# Patient Record
Sex: Female | Born: 1955 | Race: White | Hispanic: No | Marital: Married
Health system: Southern US, Community
[De-identification: ages and names within clinical notes are randomized; demographics above are authoritative.]

## PROBLEM LIST (undated history)

## (undated) DIAGNOSIS — F32A Depression, unspecified: Secondary | ICD-10-CM

## (undated) DIAGNOSIS — K5190 Ulcerative colitis, unspecified, without complications: Secondary | ICD-10-CM

## (undated) DIAGNOSIS — L932 Other local lupus erythematosus: Secondary | ICD-10-CM

## (undated) DIAGNOSIS — L409 Psoriasis, unspecified: Secondary | ICD-10-CM

## (undated) DIAGNOSIS — F419 Anxiety disorder, unspecified: Secondary | ICD-10-CM

## (undated) HISTORY — DX: Other local lupus erythematosus: L93.2

## (undated) HISTORY — PX: TUBAL LIGATION: SHX77

## (undated) HISTORY — DX: Psoriasis, unspecified: L40.9

## (undated) HISTORY — DX: Depression, unspecified: F32.A

## (undated) HISTORY — PX: COLONOSCOPY: SHX174

## (undated) HISTORY — DX: Ulcerative colitis, unspecified, without complications: K51.90

## (undated) HISTORY — PX: LUMBAR DISC SURGERY: SHX700

## (undated) HISTORY — DX: Anxiety disorder, unspecified: F41.9

---

## 1998-01-31 ENCOUNTER — Other Ambulatory Visit: Admission: RE | Admit: 1998-01-31 | Discharge: 1998-01-31 | Payer: Self-pay | Admitting: Gynecology

## 1998-02-21 ENCOUNTER — Ambulatory Visit (HOSPITAL_COMMUNITY): Admission: RE | Admit: 1998-02-21 | Discharge: 1998-02-21 | Payer: Self-pay | Admitting: Gynecology

## 2000-03-03 ENCOUNTER — Ambulatory Visit (HOSPITAL_COMMUNITY): Admission: RE | Admit: 2000-03-03 | Discharge: 2000-03-03 | Payer: Self-pay | Admitting: Gastroenterology

## 2000-03-03 ENCOUNTER — Encounter (INDEPENDENT_AMBULATORY_CARE_PROVIDER_SITE_OTHER): Payer: Self-pay

## 2000-04-06 ENCOUNTER — Encounter: Payer: Self-pay | Admitting: Gynecology

## 2000-04-06 ENCOUNTER — Ambulatory Visit (HOSPITAL_COMMUNITY): Admission: RE | Admit: 2000-04-06 | Discharge: 2000-04-06 | Payer: Self-pay | Admitting: Gynecology

## 2000-04-10 ENCOUNTER — Encounter: Admission: RE | Admit: 2000-04-10 | Discharge: 2000-04-10 | Payer: Self-pay | Admitting: Gynecology

## 2000-04-10 ENCOUNTER — Encounter: Payer: Self-pay | Admitting: Gynecology

## 2000-04-27 ENCOUNTER — Other Ambulatory Visit: Admission: RE | Admit: 2000-04-27 | Discharge: 2000-04-27 | Payer: Self-pay | Admitting: Gynecology

## 2000-09-10 ENCOUNTER — Other Ambulatory Visit: Admission: RE | Admit: 2000-09-10 | Discharge: 2000-09-10 | Payer: Self-pay | Admitting: Gynecology

## 2000-09-10 ENCOUNTER — Encounter (INDEPENDENT_AMBULATORY_CARE_PROVIDER_SITE_OTHER): Payer: Self-pay | Admitting: Specialist

## 2001-05-10 ENCOUNTER — Other Ambulatory Visit: Admission: RE | Admit: 2001-05-10 | Discharge: 2001-05-10 | Payer: Self-pay | Admitting: Gynecology

## 2001-08-16 ENCOUNTER — Encounter: Payer: Self-pay | Admitting: Gynecology

## 2001-08-16 ENCOUNTER — Encounter: Admission: RE | Admit: 2001-08-16 | Discharge: 2001-08-16 | Payer: Self-pay | Admitting: Gynecology

## 2002-09-05 ENCOUNTER — Encounter: Admission: RE | Admit: 2002-09-05 | Discharge: 2002-09-05 | Payer: Self-pay | Admitting: Gynecology

## 2002-09-05 ENCOUNTER — Other Ambulatory Visit: Admission: RE | Admit: 2002-09-05 | Discharge: 2002-09-05 | Payer: Self-pay | Admitting: Gynecology

## 2002-09-05 ENCOUNTER — Encounter: Payer: Self-pay | Admitting: Gynecology

## 2003-11-06 ENCOUNTER — Other Ambulatory Visit: Admission: RE | Admit: 2003-11-06 | Discharge: 2003-11-06 | Payer: Self-pay | Admitting: Gynecology

## 2003-11-27 ENCOUNTER — Encounter: Admission: RE | Admit: 2003-11-27 | Discharge: 2003-11-27 | Payer: Self-pay | Admitting: Gynecology

## 2004-12-02 ENCOUNTER — Encounter: Admission: RE | Admit: 2004-12-02 | Discharge: 2004-12-02 | Payer: Self-pay | Admitting: Gynecology

## 2004-12-23 ENCOUNTER — Other Ambulatory Visit: Admission: RE | Admit: 2004-12-23 | Discharge: 2004-12-23 | Payer: Self-pay | Admitting: Gynecology

## 2006-01-26 ENCOUNTER — Encounter: Admission: RE | Admit: 2006-01-26 | Discharge: 2006-01-26 | Payer: Self-pay | Admitting: Gynecology

## 2006-02-16 ENCOUNTER — Other Ambulatory Visit: Admission: RE | Admit: 2006-02-16 | Discharge: 2006-02-16 | Payer: Self-pay | Admitting: Gynecology

## 2007-02-01 ENCOUNTER — Encounter: Admission: RE | Admit: 2007-02-01 | Discharge: 2007-02-01 | Payer: Self-pay | Admitting: Gynecology

## 2007-02-22 ENCOUNTER — Other Ambulatory Visit: Admission: RE | Admit: 2007-02-22 | Discharge: 2007-02-22 | Payer: Self-pay | Admitting: Gynecology

## 2008-02-08 ENCOUNTER — Encounter: Admission: RE | Admit: 2008-02-08 | Discharge: 2008-02-08 | Payer: Self-pay | Admitting: Gynecology

## 2008-02-23 ENCOUNTER — Other Ambulatory Visit: Admission: RE | Admit: 2008-02-23 | Discharge: 2008-02-23 | Payer: Self-pay | Admitting: Gynecology

## 2009-02-12 ENCOUNTER — Encounter: Admission: RE | Admit: 2009-02-12 | Discharge: 2009-02-12 | Payer: Self-pay | Admitting: Gynecology

## 2010-02-18 ENCOUNTER — Encounter: Admission: RE | Admit: 2010-02-18 | Discharge: 2010-02-18 | Payer: Self-pay | Admitting: Gynecology

## 2011-02-21 NOTE — Procedures (Signed)
[facility]  Patient:    Denise Chambers, Denise Chambers                          MRN: [id number] Adm. Date:  [id number] Disc. Date: [id number] Attending:  Deneen Harts CC:         Anna Genre. Little, M.D.                           Procedure Report  PROCEDURE:  Colonoscopy.  ENDOSCOPIST:  Griffith Citron, M.D.  INDICATION:  [age]-year-old white female presenting with hematochezia. Undergoing colonoscopy for neoplasia surveillance.  Prior history of acute ulcerative proctitis, April 1996.  DESCRIPTION OF PROCEDURE:  After reviewing the nature of the procedure with the patient including potential risks and complications and after discussing alternative methods of diagnosis and treatment, informed consent was signed.  Patient was premedicated, receiving IV sedation, totalling Versed 12 mg, Fentanyl 125 mcg, administered in divided doses prior to the onset of the procedure.  Procedure was initiated with a PCF-140L pediatric videocolonoscope.  The scope was advanced approximately one-third of the way into the colon, when clouding of the lens necessitated removal of the scope and cleansing of the colonoscope lens.  Scope was reinserted after digital examination, revealing no evidence of perianal or intrarectal pathology.  The scope was advanced without difficulty around the entire length of the colon to the cecum, identified by the appendiceal orifice and ileocecal valve.  Preparation was excellent throughout.  Scope was slowly withdrawn, with careful inspection of the entire colon in a retrograde manner including a retroflexed view in the rectal vault.  Beginning in the sigmoid colon, approximately a half dozen diminutive polyps were scattered through the rectum.  These were resected with hot biopsy forceps, recovered and submitted to pathology.  Photo-documentation obtained. Retroflexed view in the rectal vault did reveal early non-inflamed  internal hemorrhoids.  No other abnormality was present.  Colon was decompressed, scope withdrawn.  Patient tolerated the procedure without difficulty, being maintained on datascope monitor and low-flow oxygen throughout.  Time -- 2; technical -- 2; preparation -- 1; total score equals 5.  ASSESSMENT: 1. Polyps -- multiple, diminutive, rectosigmoid, resected with hot biopsy    forceps, pathology pending. 2. Internal hemorrhoids -- mild, non-inflamed.  RECOMMENDATIONS: 1. Post-polypectomy instructions reviewed. 2. Followup pathology. 3. Repeat colonoscopy in three years if adenoma; 10 years if hyperplastic. 4. Rectal care p.r.n. DD:  03/03/00 TD:  03/05/00 Job: [id number]

## 2011-03-25 ENCOUNTER — Other Ambulatory Visit: Payer: Self-pay | Admitting: Gynecology

## 2011-03-25 DIAGNOSIS — Z1231 Encounter for screening mammogram for malignant neoplasm of breast: Secondary | ICD-10-CM

## 2011-04-10 ENCOUNTER — Ambulatory Visit
Admission: RE | Admit: 2011-04-10 | Discharge: 2011-04-10 | Disposition: A | Discharge status: Home / Self Care | Payer: 59 | Source: Ambulatory Visit | Attending: Gynecology | Admitting: Gynecology

## 2011-04-10 DIAGNOSIS — Z1231 Encounter for screening mammogram for malignant neoplasm of breast: Secondary | ICD-10-CM

## 2011-05-21 ENCOUNTER — Ambulatory Visit (INDEPENDENT_AMBULATORY_CARE_PROVIDER_SITE_OTHER): Payer: 59 | Admitting: Surgery

## 2012-05-18 ENCOUNTER — Other Ambulatory Visit: Payer: Self-pay | Admitting: Gynecology

## 2012-05-18 DIAGNOSIS — Z1231 Encounter for screening mammogram for malignant neoplasm of breast: Secondary | ICD-10-CM

## 2012-06-04 ENCOUNTER — Ambulatory Visit
Admission: RE | Admit: 2012-06-04 | Discharge: 2012-06-04 | Disposition: A | Discharge status: Home / Self Care | Payer: 59 | Source: Ambulatory Visit | Attending: Gynecology | Admitting: Gynecology

## 2012-06-04 ENCOUNTER — Ambulatory Visit: Payer: 59

## 2012-06-04 DIAGNOSIS — Z1231 Encounter for screening mammogram for malignant neoplasm of breast: Secondary | ICD-10-CM

## 2013-08-03 ENCOUNTER — Other Ambulatory Visit: Payer: Self-pay

## 2013-08-03 DIAGNOSIS — Z1231 Encounter for screening mammogram for malignant neoplasm of breast: Secondary | ICD-10-CM

## 2013-09-05 ENCOUNTER — Ambulatory Visit
Admission: RE | Admit: 2013-09-05 | Discharge: 2013-09-05 | Disposition: A | Discharge status: Home / Self Care | Payer: BC Managed Care – PPO | Source: Ambulatory Visit

## 2013-09-05 DIAGNOSIS — Z1231 Encounter for screening mammogram for malignant neoplasm of breast: Secondary | ICD-10-CM

## 2014-12-18 ENCOUNTER — Other Ambulatory Visit: Payer: Self-pay

## 2014-12-18 DIAGNOSIS — Z1231 Encounter for screening mammogram for malignant neoplasm of breast: Secondary | ICD-10-CM

## 2014-12-25 ENCOUNTER — Ambulatory Visit
Admission: RE | Admit: 2014-12-25 | Discharge: 2014-12-25 | Disposition: A | Discharge status: Home / Self Care | Payer: 59 | Source: Ambulatory Visit

## 2014-12-25 DIAGNOSIS — Z1231 Encounter for screening mammogram for malignant neoplasm of breast: Secondary | ICD-10-CM

## 2016-02-19 ENCOUNTER — Other Ambulatory Visit: Payer: Self-pay

## 2016-02-19 DIAGNOSIS — Z1231 Encounter for screening mammogram for malignant neoplasm of breast: Secondary | ICD-10-CM

## 2016-03-06 ENCOUNTER — Ambulatory Visit
Admission: RE | Admit: 2016-03-06 | Discharge: 2016-03-06 | Disposition: A | Discharge status: Home / Self Care | Payer: 59 | Source: Ambulatory Visit

## 2016-03-06 DIAGNOSIS — Z1231 Encounter for screening mammogram for malignant neoplasm of breast: Secondary | ICD-10-CM

## 2017-02-18 ENCOUNTER — Other Ambulatory Visit: Payer: Self-pay | Admitting: Obstetrics and Gynecology

## 2017-02-18 DIAGNOSIS — Z1231 Encounter for screening mammogram for malignant neoplasm of breast: Secondary | ICD-10-CM

## 2017-03-20 ENCOUNTER — Ambulatory Visit
Admission: RE | Admit: 2017-03-20 | Discharge: 2017-03-20 | Disposition: A | Discharge status: Home / Self Care | Payer: 59 | Source: Ambulatory Visit | Attending: Obstetrics and Gynecology | Admitting: Obstetrics and Gynecology

## 2017-03-20 DIAGNOSIS — Z1231 Encounter for screening mammogram for malignant neoplasm of breast: Secondary | ICD-10-CM

## 2017-12-04 DIAGNOSIS — M25512 Pain in left shoulder: Secondary | ICD-10-CM | POA: Diagnosis not present

## 2017-12-15 DIAGNOSIS — Z0000 Encounter for general adult medical examination without abnormal findings: Secondary | ICD-10-CM | POA: Diagnosis not present

## 2017-12-15 DIAGNOSIS — M859 Disorder of bone density and structure, unspecified: Secondary | ICD-10-CM | POA: Diagnosis not present

## 2017-12-15 DIAGNOSIS — E7849 Other hyperlipidemia: Secondary | ICD-10-CM | POA: Diagnosis not present

## 2017-12-21 DIAGNOSIS — J3089 Other allergic rhinitis: Secondary | ICD-10-CM | POA: Diagnosis not present

## 2017-12-21 DIAGNOSIS — N393 Stress incontinence (female) (male): Secondary | ICD-10-CM | POA: Diagnosis not present

## 2017-12-21 DIAGNOSIS — Z1331 Encounter for screening for depression: Secondary | ICD-10-CM | POA: Diagnosis not present

## 2017-12-21 DIAGNOSIS — Z0000 Encounter for general adult medical examination without abnormal findings: Secondary | ICD-10-CM | POA: Diagnosis not present

## 2017-12-21 DIAGNOSIS — K5190 Ulcerative colitis, unspecified, without complications: Secondary | ICD-10-CM | POA: Diagnosis not present

## 2017-12-21 DIAGNOSIS — M2550 Pain in unspecified joint: Secondary | ICD-10-CM | POA: Diagnosis not present

## 2017-12-24 DIAGNOSIS — Z1212 Encounter for screening for malignant neoplasm of rectum: Secondary | ICD-10-CM | POA: Diagnosis not present

## 2018-03-03 DIAGNOSIS — K5900 Constipation, unspecified: Secondary | ICD-10-CM | POA: Diagnosis not present

## 2018-03-03 DIAGNOSIS — K625 Hemorrhage of anus and rectum: Secondary | ICD-10-CM | POA: Diagnosis not present

## 2018-03-03 DIAGNOSIS — Z86010 Personal history of colonic polyps: Secondary | ICD-10-CM | POA: Insufficient documentation

## 2018-03-03 DIAGNOSIS — K5120 Ulcerative (chronic) proctitis without complications: Secondary | ICD-10-CM | POA: Insufficient documentation

## 2018-04-19 ENCOUNTER — Other Ambulatory Visit: Payer: Self-pay | Admitting: Obstetrics and Gynecology

## 2018-04-19 DIAGNOSIS — Z1231 Encounter for screening mammogram for malignant neoplasm of breast: Secondary | ICD-10-CM

## 2018-05-07 ENCOUNTER — Ambulatory Visit: Payer: 59

## 2018-05-31 ENCOUNTER — Ambulatory Visit
Admission: RE | Admit: 2018-05-31 | Discharge: 2018-05-31 | Disposition: A | Discharge status: Home / Self Care | Payer: BLUE CROSS/BLUE SHIELD | Source: Ambulatory Visit | Attending: Obstetrics and Gynecology | Admitting: Obstetrics and Gynecology

## 2018-05-31 DIAGNOSIS — Z1231 Encounter for screening mammogram for malignant neoplasm of breast: Secondary | ICD-10-CM

## 2018-08-25 DIAGNOSIS — M79604 Pain in right leg: Secondary | ICD-10-CM | POA: Diagnosis not present

## 2018-09-08 DIAGNOSIS — M79604 Pain in right leg: Secondary | ICD-10-CM | POA: Diagnosis not present

## 2018-09-14 DIAGNOSIS — G629 Polyneuropathy, unspecified: Secondary | ICD-10-CM | POA: Diagnosis not present

## 2018-09-20 DIAGNOSIS — M9902 Segmental and somatic dysfunction of thoracic region: Secondary | ICD-10-CM | POA: Diagnosis not present

## 2018-09-20 DIAGNOSIS — M9903 Segmental and somatic dysfunction of lumbar region: Secondary | ICD-10-CM | POA: Diagnosis not present

## 2018-09-20 DIAGNOSIS — M9905 Segmental and somatic dysfunction of pelvic region: Secondary | ICD-10-CM | POA: Diagnosis not present

## 2018-09-20 DIAGNOSIS — M9906 Segmental and somatic dysfunction of lower extremity: Secondary | ICD-10-CM | POA: Diagnosis not present

## 2018-10-11 DIAGNOSIS — K219 Gastro-esophageal reflux disease without esophagitis: Secondary | ICD-10-CM | POA: Diagnosis not present

## 2018-10-11 DIAGNOSIS — Z6828 Body mass index (BMI) 28.0-28.9, adult: Secondary | ICD-10-CM | POA: Diagnosis not present

## 2018-10-11 DIAGNOSIS — M25551 Pain in right hip: Secondary | ICD-10-CM | POA: Diagnosis not present

## 2018-10-11 DIAGNOSIS — M545 Low back pain: Secondary | ICD-10-CM | POA: Diagnosis not present

## 2018-10-12 ENCOUNTER — Ambulatory Visit
Admission: RE | Admit: 2018-10-12 | Discharge: 2018-10-12 | Disposition: A | Discharge status: Home / Self Care | Payer: BLUE CROSS/BLUE SHIELD | Source: Ambulatory Visit | Attending: Internal Medicine | Admitting: Internal Medicine

## 2018-10-12 ENCOUNTER — Other Ambulatory Visit: Payer: Self-pay | Admitting: Internal Medicine

## 2018-10-12 DIAGNOSIS — M48061 Spinal stenosis, lumbar region without neurogenic claudication: Secondary | ICD-10-CM | POA: Diagnosis not present

## 2018-10-12 DIAGNOSIS — M25551 Pain in right hip: Secondary | ICD-10-CM

## 2018-10-12 DIAGNOSIS — M5126 Other intervertebral disc displacement, lumbar region: Secondary | ICD-10-CM | POA: Diagnosis not present

## 2018-10-12 DIAGNOSIS — M47816 Spondylosis without myelopathy or radiculopathy, lumbar region: Secondary | ICD-10-CM | POA: Diagnosis not present

## 2018-10-12 DIAGNOSIS — M545 Low back pain, unspecified: Secondary | ICD-10-CM

## 2018-10-12 MED ORDER — GADOBENATE DIMEGLUMINE 529 MG/ML IV SOLN
15.0000 mL | Freq: Once | INTRAVENOUS | Status: AC | PRN
  Administered 2018-10-12: 15 mL via INTRAVENOUS

## 2018-10-15 DIAGNOSIS — M47816 Spondylosis without myelopathy or radiculopathy, lumbar region: Secondary | ICD-10-CM | POA: Diagnosis not present

## 2018-10-15 DIAGNOSIS — M5416 Radiculopathy, lumbar region: Secondary | ICD-10-CM | POA: Diagnosis not present

## 2018-10-15 DIAGNOSIS — M546 Pain in thoracic spine: Secondary | ICD-10-CM | POA: Diagnosis not present

## 2018-10-15 DIAGNOSIS — M5136 Other intervertebral disc degeneration, lumbar region: Secondary | ICD-10-CM | POA: Diagnosis not present

## 2018-10-15 DIAGNOSIS — M5126 Other intervertebral disc displacement, lumbar region: Secondary | ICD-10-CM | POA: Diagnosis not present

## 2018-10-15 DIAGNOSIS — M549 Dorsalgia, unspecified: Secondary | ICD-10-CM | POA: Diagnosis not present

## 2018-10-20 DIAGNOSIS — M48061 Spinal stenosis, lumbar region without neurogenic claudication: Secondary | ICD-10-CM | POA: Diagnosis not present

## 2018-10-20 DIAGNOSIS — M4726 Other spondylosis with radiculopathy, lumbar region: Secondary | ICD-10-CM | POA: Diagnosis not present

## 2018-10-20 DIAGNOSIS — M5136 Other intervertebral disc degeneration, lumbar region: Secondary | ICD-10-CM | POA: Diagnosis not present

## 2018-11-03 DIAGNOSIS — L821 Other seborrheic keratosis: Secondary | ICD-10-CM | POA: Diagnosis not present

## 2018-11-03 DIAGNOSIS — L570 Actinic keratosis: Secondary | ICD-10-CM | POA: Diagnosis not present

## 2018-11-15 ENCOUNTER — Ambulatory Visit: Payer: BLUE CROSS/BLUE SHIELD | Admitting: Diagnostic Neuroimaging

## 2018-12-06 DIAGNOSIS — Z01419 Encounter for gynecological examination (general) (routine) without abnormal findings: Secondary | ICD-10-CM | POA: Diagnosis not present

## 2018-12-06 DIAGNOSIS — Z6829 Body mass index (BMI) 29.0-29.9, adult: Secondary | ICD-10-CM | POA: Diagnosis not present

## 2018-12-13 DIAGNOSIS — M5416 Radiculopathy, lumbar region: Secondary | ICD-10-CM | POA: Diagnosis not present

## 2018-12-13 DIAGNOSIS — M47816 Spondylosis without myelopathy or radiculopathy, lumbar region: Secondary | ICD-10-CM | POA: Diagnosis not present

## 2018-12-13 DIAGNOSIS — M5136 Other intervertebral disc degeneration, lumbar region: Secondary | ICD-10-CM | POA: Diagnosis not present

## 2018-12-13 DIAGNOSIS — M5126 Other intervertebral disc displacement, lumbar region: Secondary | ICD-10-CM | POA: Diagnosis not present

## 2018-12-27 DIAGNOSIS — M48061 Spinal stenosis, lumbar region without neurogenic claudication: Secondary | ICD-10-CM | POA: Diagnosis not present

## 2018-12-27 DIAGNOSIS — M4726 Other spondylosis with radiculopathy, lumbar region: Secondary | ICD-10-CM | POA: Diagnosis not present

## 2018-12-27 DIAGNOSIS — M5136 Other intervertebral disc degeneration, lumbar region: Secondary | ICD-10-CM | POA: Diagnosis not present

## 2019-03-15 DIAGNOSIS — M5416 Radiculopathy, lumbar region: Secondary | ICD-10-CM | POA: Diagnosis not present

## 2019-03-15 DIAGNOSIS — M47816 Spondylosis without myelopathy or radiculopathy, lumbar region: Secondary | ICD-10-CM | POA: Diagnosis not present

## 2019-03-15 DIAGNOSIS — M5126 Other intervertebral disc displacement, lumbar region: Secondary | ICD-10-CM | POA: Diagnosis not present

## 2019-03-15 DIAGNOSIS — M48062 Spinal stenosis, lumbar region with neurogenic claudication: Secondary | ICD-10-CM | POA: Diagnosis not present

## 2019-03-24 DIAGNOSIS — M5136 Other intervertebral disc degeneration, lumbar region: Secondary | ICD-10-CM | POA: Diagnosis not present

## 2019-03-24 DIAGNOSIS — M48061 Spinal stenosis, lumbar region without neurogenic claudication: Secondary | ICD-10-CM | POA: Diagnosis not present

## 2019-03-24 DIAGNOSIS — M4726 Other spondylosis with radiculopathy, lumbar region: Secondary | ICD-10-CM | POA: Diagnosis not present

## 2019-04-22 ENCOUNTER — Other Ambulatory Visit: Payer: Self-pay | Admitting: Obstetrics and Gynecology

## 2019-04-22 DIAGNOSIS — Z1231 Encounter for screening mammogram for malignant neoplasm of breast: Secondary | ICD-10-CM

## 2019-05-03 ENCOUNTER — Ambulatory Visit (INDEPENDENT_AMBULATORY_CARE_PROVIDER_SITE_OTHER): Payer: BC Managed Care – PPO | Admitting: Family Medicine

## 2019-05-03 ENCOUNTER — Encounter: Payer: Self-pay | Admitting: Family Medicine

## 2019-05-03 DIAGNOSIS — M5441 Lumbago with sciatica, right side: Secondary | ICD-10-CM

## 2019-05-03 DIAGNOSIS — G8929 Other chronic pain: Secondary | ICD-10-CM | POA: Diagnosis not present

## 2019-05-03 NOTE — Progress Notes (Signed)
   Office Visit Note   Patient: Denise Chambers           Date of Birth: [birth year]           MRN: [id number] Visit Date: 05/03/2019 Requested by: Shon Baton, [address] [place],  [place] [address] PCP: Shon Baton, MD  Subjective: Chief Complaint  Patient presents with  . Lower Back - Pain    Referred by [facility]. Has been going to [facility] for LBP. Brought CDs of plain xrays & MRI. S/p ESI x 3. Hurts to sit longer than 10 minutes, without having to stretch. Walks every day for exercise.    HPI: She is a 63 year old seen in the presence of [place] for low back and right leg pain.  Symptoms started in November, no injury.  She was doing a lot of traveling at that time and started noticing pain going down the posterior right hip into the lateral calf especially with sitting.  Sometimes it bothers her with walking.  Pain was severe at first.  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  She has been treated by Dr. Sherwood Gambler with 3 epidural steroid injections.  These have helped with her pain, she was able to sit for a while but now the pain is coming back.               ROS: Denies fevers or chills, denies bowel or bladder dysfunction.  All other systems were reviewed and are negative.  Objective: Vital Signs: There were no vitals taken for this visit.  Physical Exam:  General:  Alert and oriented, in no acute distress. Pulm:  Breathing unlabored. Psy:  Normal mood, congruent affect. Skin: No rash on her skin. Low back: No tenderness to palpation lumbar spinous processes.  Slight tenderness near the SI joint, negative stork test.  Negative straight leg raise on the left, slightly positive on the right.  Moderate pain in the sciatic notch, piriformis stretch is equivocal.  Lower extremity strength and reflexes are normal.  Imaging: None today.  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   Assessment & Plan: 1.  Chronic right posterior hip and leg pain, seems to be in the L4 distribution, suspect mainly related to disc protrusions. -We will try McKenzie protocol.  She also plans to try an inversion table.  If this makes her symptoms manageable, she can do this indefinitely and periodically get epidural injections if needed.     Procedures: No procedures performed  No notes on file     PMFS History: There are no active problems to display for this patient.  History reviewed. No pertinent past medical history.  Family History  Problem Relation Age of Onset  . Breast cancer Maternal Aunt     History reviewed. No pertinent surgical history. Social History   Occupational History  . Not on file  Tobacco Use  . Smoking status: Not on file  Substance and Sexual Activity  . Alcohol use: Not on file  . Drug use: Not on file  . Sexual activity: Not on file

## 2019-05-26 DIAGNOSIS — Z23 Encounter for immunization: Secondary | ICD-10-CM | POA: Diagnosis not present

## 2019-05-26 DIAGNOSIS — E7849 Other hyperlipidemia: Secondary | ICD-10-CM | POA: Diagnosis not present

## 2019-05-26 DIAGNOSIS — M859 Disorder of bone density and structure, unspecified: Secondary | ICD-10-CM | POA: Diagnosis not present

## 2019-05-26 DIAGNOSIS — Z0000 Encounter for general adult medical examination without abnormal findings: Secondary | ICD-10-CM | POA: Diagnosis not present

## 2019-06-01 DIAGNOSIS — R82998 Other abnormal findings in urine: Secondary | ICD-10-CM | POA: Diagnosis not present

## 2019-06-09 ENCOUNTER — Other Ambulatory Visit: Payer: Self-pay

## 2019-06-09 ENCOUNTER — Ambulatory Visit
Admission: RE | Admit: 2019-06-09 | Discharge: 2019-06-09 | Disposition: A | Discharge status: Home / Self Care | Payer: BC Managed Care – PPO | Source: Ambulatory Visit | Attending: Obstetrics and Gynecology | Admitting: Obstetrics and Gynecology

## 2019-06-09 DIAGNOSIS — Z1231 Encounter for screening mammogram for malignant neoplasm of breast: Secondary | ICD-10-CM

## 2019-06-10 DIAGNOSIS — Z0000 Encounter for general adult medical examination without abnormal findings: Secondary | ICD-10-CM | POA: Diagnosis not present

## 2019-06-10 DIAGNOSIS — K219 Gastro-esophageal reflux disease without esophagitis: Secondary | ICD-10-CM | POA: Diagnosis not present

## 2019-06-10 DIAGNOSIS — K5190 Ulcerative colitis, unspecified, without complications: Secondary | ICD-10-CM | POA: Diagnosis not present

## 2019-06-10 DIAGNOSIS — R030 Elevated blood-pressure reading, without diagnosis of hypertension: Secondary | ICD-10-CM | POA: Diagnosis not present

## 2019-06-10 DIAGNOSIS — M545 Low back pain: Secondary | ICD-10-CM | POA: Diagnosis not present

## 2019-06-16 ENCOUNTER — Other Ambulatory Visit: Payer: Self-pay | Admitting: Internal Medicine

## 2019-06-16 DIAGNOSIS — E785 Hyperlipidemia, unspecified: Secondary | ICD-10-CM

## 2019-06-17 DIAGNOSIS — M47816 Spondylosis without myelopathy or radiculopathy, lumbar region: Secondary | ICD-10-CM | POA: Diagnosis not present

## 2019-06-17 DIAGNOSIS — M48062 Spinal stenosis, lumbar region with neurogenic claudication: Secondary | ICD-10-CM | POA: Diagnosis not present

## 2019-06-17 DIAGNOSIS — M5126 Other intervertebral disc displacement, lumbar region: Secondary | ICD-10-CM | POA: Diagnosis not present

## 2019-06-17 DIAGNOSIS — M5136 Other intervertebral disc degeneration, lumbar region: Secondary | ICD-10-CM | POA: Diagnosis not present

## 2019-06-23 DIAGNOSIS — L821 Other seborrheic keratosis: Secondary | ICD-10-CM | POA: Diagnosis not present

## 2019-06-23 DIAGNOSIS — L720 Epidermal cyst: Secondary | ICD-10-CM | POA: Diagnosis not present

## 2019-06-23 DIAGNOSIS — D2271 Melanocytic nevi of right lower limb, including hip: Secondary | ICD-10-CM | POA: Diagnosis not present

## 2019-06-24 ENCOUNTER — Ambulatory Visit
Admission: RE | Admit: 2019-06-24 | Discharge: 2019-06-24 | Disposition: A | Discharge status: Home / Self Care | Payer: No Typology Code available for payment source | Source: Ambulatory Visit | Attending: Internal Medicine | Admitting: Internal Medicine

## 2019-06-24 DIAGNOSIS — Z8249 Family history of ischemic heart disease and other diseases of the circulatory system: Secondary | ICD-10-CM | POA: Diagnosis not present

## 2019-06-24 DIAGNOSIS — E785 Hyperlipidemia, unspecified: Secondary | ICD-10-CM

## 2019-06-27 DIAGNOSIS — Z1212 Encounter for screening for malignant neoplasm of rectum: Secondary | ICD-10-CM | POA: Diagnosis not present

## 2019-06-28 ENCOUNTER — Ambulatory Visit (INDEPENDENT_AMBULATORY_CARE_PROVIDER_SITE_OTHER): Payer: BC Managed Care – PPO | Admitting: Family Medicine

## 2019-06-28 ENCOUNTER — Encounter: Payer: Self-pay | Admitting: Family Medicine

## 2019-06-28 ENCOUNTER — Other Ambulatory Visit: Payer: Self-pay

## 2019-06-28 DIAGNOSIS — M79671 Pain in right foot: Secondary | ICD-10-CM

## 2019-06-28 DIAGNOSIS — G8929 Other chronic pain: Secondary | ICD-10-CM | POA: Diagnosis not present

## 2019-06-28 NOTE — Progress Notes (Signed)
I saw and examined the patient with Dr. Mayer Masker and agree with assessment and plan as outlined.    Right heel pain consistent with plantar fasciitis.  Tight hamstrings and heel cords predispose.  Back and right leg pain significantly better with inversion table.   Will treat with home stretches, ice water soaks.  Could do formal PT if not improving (has high deductible plan), or cortisone injection.

## 2019-06-28 NOTE — Progress Notes (Signed)
   Denise Chambers - 63 y.o. female MRN [id number]  Date of birth: 11/23/1955  Office Visit Note: Visit Date: 06/28/2019 PCP: Shon Baton, MD Referred by: Shon Baton, MD  Subjective: Chief Complaint  Patient presents with  . Right Foot - Pain    Pain medial aspect of heel with swelling, worsening since March of this year (when she started walking for exercise more). Pain is worse first thing in the morning.   HPI: Denise Chambers is a 63 y.o. female who comes in today with right heel pain for the past 6 months. Reports that pain is worse in the morning with first steps, improves mostly throughout the day but is sore after her walks. Has been walking ~3 miles daily for the past 6 months.   Reports improvement in right leg pain overall since using inversion table.    ROS Otherwise per HPI.  Assessment & Plan: Visit Diagnoses:  1. Chronic heel pain, right     Plan:  - home stretches of calves, heel cords - soak in ice water as tolerated - has high deductible. If she meets deductible, would like to do PT (will call back)  Meds & Orders: No orders of the defined types were placed in this encounter.  No orders of the defined types were placed in this encounter.   Follow-up: PRN  Procedures: No procedures performed  No notes on file   Clinical History: No specialty comments available.   She has no history on file for tobacco. No results for input(s): HGBA1C, LABURIC in the last 8760 hours.  Objective:  VS:  HT:    WT:   BMI:     BP:   HR: bpm  TEMP: ( )  RESP:  Gen: well appearing Resp: breathing comfortably Psych: mood congruent  Right Foot: Inspection: No obvious bony deformity.  No swelling, erythema, or bruising.   Palpation: TTP at medial calcaneus at insertion of plantar fascia.  ROM: Full  ROM of the ankle. Limited dorsiflexion of right, 90 degrees. Strength: 5/5 strength ankle in all planes Neurovascular: N/V intact distally in the lower extremity  Special tests: No pain with flexion of small toes against resistance. Negative squeeze. Negative tarsal tunnel sign.   Right leg: TTP at medial leg proximal to knee No pain with resisted inversion, knee extension, knee flexion Passive abduction of leg with no pain  Imaging: No results found.  Past Medical/Family/Surgical/Social History: Medications & Allergies reviewed per EMR, new medications updated. Patient Active Problem List   Diagnosis Date Noted  . History of adenomatous polyp of colon 03/03/2018  . 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 No pertinent past medical history. Family History  Problem Relation Age of Onset  . Breast cancer Maternal Aunt    History reviewed. No pertinent surgical history. Social History   Occupational History  . Not on file  Tobacco Use  . Smoking status: Not on file  Substance and Sexual Activity  . Alcohol use: Not on file  . Drug use: Not on file  . Sexual activity: Not on file

## 2019-08-22 ENCOUNTER — Other Ambulatory Visit: Payer: Self-pay

## 2019-08-22 DIAGNOSIS — Z20822 Contact with and (suspected) exposure to covid-19: Secondary | ICD-10-CM

## 2019-08-23 ENCOUNTER — Encounter: Payer: Self-pay | Admitting: Family Medicine

## 2019-08-25 LAB — NOVEL CORONAVIRUS, NAA: SARS-CoV-2, NAA: NOT DETECTED

## 2019-09-26 DIAGNOSIS — M5136 Other intervertebral disc degeneration, lumbar region: Secondary | ICD-10-CM | POA: Diagnosis not present

## 2019-09-26 DIAGNOSIS — M48061 Spinal stenosis, lumbar region without neurogenic claudication: Secondary | ICD-10-CM | POA: Diagnosis not present

## 2019-09-26 DIAGNOSIS — M4726 Other spondylosis with radiculopathy, lumbar region: Secondary | ICD-10-CM | POA: Diagnosis not present

## 2019-09-27 DIAGNOSIS — L570 Actinic keratosis: Secondary | ICD-10-CM | POA: Diagnosis not present

## 2019-09-27 DIAGNOSIS — L820 Inflamed seborrheic keratosis: Secondary | ICD-10-CM | POA: Diagnosis not present

## 2019-12-26 ENCOUNTER — Ambulatory Visit: Payer: BC Managed Care – PPO | Attending: Internal Medicine

## 2019-12-26 DIAGNOSIS — Z20822 Contact with and (suspected) exposure to covid-19: Secondary | ICD-10-CM | POA: Insufficient documentation

## 2019-12-27 LAB — NOVEL CORONAVIRUS, NAA: SARS-CoV-2, NAA: NOT DETECTED

## 2019-12-27 LAB — SARS-COV-2, NAA 2 DAY TAT

## 2020-02-21 DIAGNOSIS — M5416 Radiculopathy, lumbar region: Secondary | ICD-10-CM | POA: Diagnosis not present

## 2020-03-15 DIAGNOSIS — M5416 Radiculopathy, lumbar region: Secondary | ICD-10-CM | POA: Diagnosis not present

## 2020-04-02 ENCOUNTER — Other Ambulatory Visit: Payer: Self-pay | Admitting: Anesthesiology

## 2020-04-02 DIAGNOSIS — M5416 Radiculopathy, lumbar region: Secondary | ICD-10-CM

## 2020-04-23 ENCOUNTER — Ambulatory Visit
Admission: RE | Admit: 2020-04-23 | Discharge: 2020-04-23 | Disposition: A | Discharge status: Home / Self Care | Payer: Medicare Other | Source: Ambulatory Visit | Attending: Anesthesiology | Admitting: Anesthesiology

## 2020-04-23 DIAGNOSIS — M5416 Radiculopathy, lumbar region: Secondary | ICD-10-CM

## 2020-04-23 DIAGNOSIS — M48061 Spinal stenosis, lumbar region without neurogenic claudication: Secondary | ICD-10-CM | POA: Diagnosis not present

## 2020-04-24 DIAGNOSIS — K648 Other hemorrhoids: Secondary | ICD-10-CM | POA: Diagnosis not present

## 2020-04-24 DIAGNOSIS — D125 Benign neoplasm of sigmoid colon: Secondary | ICD-10-CM | POA: Diagnosis not present

## 2020-04-24 DIAGNOSIS — K5120 Ulcerative (chronic) proctitis without complications: Secondary | ICD-10-CM | POA: Diagnosis not present

## 2020-04-24 DIAGNOSIS — Z1211 Encounter for screening for malignant neoplasm of colon: Secondary | ICD-10-CM | POA: Diagnosis not present

## 2020-04-24 DIAGNOSIS — K5190 Ulcerative colitis, unspecified, without complications: Secondary | ICD-10-CM | POA: Diagnosis not present

## 2020-04-24 DIAGNOSIS — K51913 Ulcerative colitis, unspecified with fistula: Secondary | ICD-10-CM | POA: Diagnosis not present

## 2020-04-24 DIAGNOSIS — K635 Polyp of colon: Secondary | ICD-10-CM | POA: Diagnosis not present

## 2020-04-24 DIAGNOSIS — Z86010 Personal history of colonic polyps: Secondary | ICD-10-CM | POA: Diagnosis not present

## 2020-05-02 DIAGNOSIS — L821 Other seborrheic keratosis: Secondary | ICD-10-CM | POA: Diagnosis not present

## 2020-05-02 DIAGNOSIS — L2084 Intrinsic (allergic) eczema: Secondary | ICD-10-CM | POA: Diagnosis not present

## 2020-05-02 DIAGNOSIS — L570 Actinic keratosis: Secondary | ICD-10-CM | POA: Diagnosis not present

## 2020-05-07 DIAGNOSIS — M5126 Other intervertebral disc displacement, lumbar region: Secondary | ICD-10-CM | POA: Diagnosis not present

## 2020-05-07 DIAGNOSIS — M5416 Radiculopathy, lumbar region: Secondary | ICD-10-CM | POA: Diagnosis not present

## 2020-05-07 DIAGNOSIS — M48062 Spinal stenosis, lumbar region with neurogenic claudication: Secondary | ICD-10-CM | POA: Diagnosis not present

## 2020-05-07 DIAGNOSIS — M5136 Other intervertebral disc degeneration, lumbar region: Secondary | ICD-10-CM | POA: Diagnosis not present

## 2020-05-08 DIAGNOSIS — H524 Presbyopia: Secondary | ICD-10-CM | POA: Diagnosis not present

## 2020-05-09 DIAGNOSIS — Z6827 Body mass index (BMI) 27.0-27.9, adult: Secondary | ICD-10-CM | POA: Diagnosis not present

## 2020-05-09 DIAGNOSIS — Z01419 Encounter for gynecological examination (general) (routine) without abnormal findings: Secondary | ICD-10-CM | POA: Diagnosis not present

## 2020-05-22 DIAGNOSIS — M4726 Other spondylosis with radiculopathy, lumbar region: Secondary | ICD-10-CM | POA: Diagnosis not present

## 2020-05-22 DIAGNOSIS — M5116 Intervertebral disc disorders with radiculopathy, lumbar region: Secondary | ICD-10-CM | POA: Diagnosis not present

## 2020-05-22 DIAGNOSIS — M48062 Spinal stenosis, lumbar region with neurogenic claudication: Secondary | ICD-10-CM | POA: Diagnosis not present

## 2020-05-25 DIAGNOSIS — M8588 Other specified disorders of bone density and structure, other site: Secondary | ICD-10-CM | POA: Diagnosis not present

## 2020-05-25 DIAGNOSIS — N958 Other specified menopausal and perimenopausal disorders: Secondary | ICD-10-CM | POA: Diagnosis not present

## 2020-06-04 ENCOUNTER — Other Ambulatory Visit: Payer: Self-pay | Admitting: Obstetrics and Gynecology

## 2020-06-04 DIAGNOSIS — Z1231 Encounter for screening mammogram for malignant neoplasm of breast: Secondary | ICD-10-CM

## 2020-06-05 DIAGNOSIS — E559 Vitamin D deficiency, unspecified: Secondary | ICD-10-CM | POA: Diagnosis not present

## 2020-06-05 DIAGNOSIS — H524 Presbyopia: Secondary | ICD-10-CM | POA: Diagnosis not present

## 2020-06-05 DIAGNOSIS — E7800 Pure hypercholesterolemia, unspecified: Secondary | ICD-10-CM | POA: Diagnosis not present

## 2020-06-05 DIAGNOSIS — K5120 Ulcerative (chronic) proctitis without complications: Secondary | ICD-10-CM | POA: Diagnosis not present

## 2020-06-12 ENCOUNTER — Other Ambulatory Visit: Payer: Self-pay | Admitting: Internal Medicine

## 2020-06-12 DIAGNOSIS — K5190 Ulcerative colitis, unspecified, without complications: Secondary | ICD-10-CM | POA: Diagnosis not present

## 2020-06-12 DIAGNOSIS — Z79899 Other long term (current) drug therapy: Secondary | ICD-10-CM | POA: Diagnosis not present

## 2020-06-12 DIAGNOSIS — Z0000 Encounter for general adult medical examination without abnormal findings: Secondary | ICD-10-CM | POA: Diagnosis not present

## 2020-06-12 DIAGNOSIS — R911 Solitary pulmonary nodule: Secondary | ICD-10-CM

## 2020-06-12 DIAGNOSIS — Z1212 Encounter for screening for malignant neoplasm of rectum: Secondary | ICD-10-CM | POA: Diagnosis not present

## 2020-06-12 DIAGNOSIS — D8989 Other specified disorders involving the immune mechanism, not elsewhere classified: Secondary | ICD-10-CM | POA: Diagnosis not present

## 2020-06-18 DIAGNOSIS — M48062 Spinal stenosis, lumbar region with neurogenic claudication: Secondary | ICD-10-CM | POA: Diagnosis not present

## 2020-06-18 DIAGNOSIS — M5136 Other intervertebral disc degeneration, lumbar region: Secondary | ICD-10-CM | POA: Diagnosis not present

## 2020-06-18 DIAGNOSIS — M549 Dorsalgia, unspecified: Secondary | ICD-10-CM | POA: Diagnosis not present

## 2020-06-18 DIAGNOSIS — M5126 Other intervertebral disc displacement, lumbar region: Secondary | ICD-10-CM | POA: Diagnosis not present

## 2020-06-26 ENCOUNTER — Ambulatory Visit
Admission: RE | Admit: 2020-06-26 | Discharge: 2020-06-26 | Disposition: A | Discharge status: Home / Self Care | Payer: Medicare Other | Source: Ambulatory Visit | Attending: Obstetrics and Gynecology | Admitting: Obstetrics and Gynecology

## 2020-06-26 ENCOUNTER — Ambulatory Visit
Admission: RE | Admit: 2020-06-26 | Discharge: 2020-06-26 | Disposition: A | Discharge status: Home / Self Care | Payer: Medicare Other | Source: Ambulatory Visit | Attending: Internal Medicine | Admitting: Internal Medicine

## 2020-06-26 ENCOUNTER — Other Ambulatory Visit: Payer: Self-pay

## 2020-06-26 DIAGNOSIS — Z1231 Encounter for screening mammogram for malignant neoplasm of breast: Secondary | ICD-10-CM | POA: Diagnosis not present

## 2020-06-26 DIAGNOSIS — I700 Atherosclerosis of aorta: Secondary | ICD-10-CM | POA: Diagnosis not present

## 2020-06-26 DIAGNOSIS — I2510 Atherosclerotic heart disease of native coronary artery without angina pectoris: Secondary | ICD-10-CM | POA: Diagnosis not present

## 2020-06-26 DIAGNOSIS — J90 Pleural effusion, not elsewhere classified: Secondary | ICD-10-CM | POA: Diagnosis not present

## 2020-06-26 DIAGNOSIS — I313 Pericardial effusion (noninflammatory): Secondary | ICD-10-CM | POA: Diagnosis not present

## 2020-06-26 DIAGNOSIS — R911 Solitary pulmonary nodule: Secondary | ICD-10-CM

## 2020-07-05 DIAGNOSIS — L821 Other seborrheic keratosis: Secondary | ICD-10-CM | POA: Diagnosis not present

## 2020-07-05 DIAGNOSIS — D225 Melanocytic nevi of trunk: Secondary | ICD-10-CM | POA: Diagnosis not present

## 2020-07-05 DIAGNOSIS — L578 Other skin changes due to chronic exposure to nonionizing radiation: Secondary | ICD-10-CM | POA: Diagnosis not present

## 2020-07-05 DIAGNOSIS — L723 Sebaceous cyst: Secondary | ICD-10-CM | POA: Diagnosis not present

## 2020-07-21 DIAGNOSIS — Z23 Encounter for immunization: Secondary | ICD-10-CM | POA: Diagnosis not present

## 2020-07-26 DIAGNOSIS — M542 Cervicalgia: Secondary | ICD-10-CM | POA: Diagnosis not present

## 2020-07-26 DIAGNOSIS — M7712 Lateral epicondylitis, left elbow: Secondary | ICD-10-CM | POA: Diagnosis not present

## 2020-07-26 DIAGNOSIS — M7582 Other shoulder lesions, left shoulder: Secondary | ICD-10-CM | POA: Diagnosis not present

## 2020-09-11 DIAGNOSIS — M48061 Spinal stenosis, lumbar region without neurogenic claudication: Secondary | ICD-10-CM | POA: Diagnosis not present

## 2020-09-11 DIAGNOSIS — M5136 Other intervertebral disc degeneration, lumbar region: Secondary | ICD-10-CM | POA: Diagnosis not present

## 2020-11-27 DIAGNOSIS — H52223 Regular astigmatism, bilateral: Secondary | ICD-10-CM | POA: Diagnosis not present

## 2020-11-27 DIAGNOSIS — H04129 Dry eye syndrome of unspecified lacrimal gland: Secondary | ICD-10-CM | POA: Diagnosis not present

## 2020-11-27 DIAGNOSIS — H5203 Hypermetropia, bilateral: Secondary | ICD-10-CM | POA: Diagnosis not present

## 2020-11-27 DIAGNOSIS — H524 Presbyopia: Secondary | ICD-10-CM | POA: Diagnosis not present

## 2020-12-04 DIAGNOSIS — M5416 Radiculopathy, lumbar region: Secondary | ICD-10-CM | POA: Diagnosis not present

## 2020-12-20 DIAGNOSIS — M5416 Radiculopathy, lumbar region: Secondary | ICD-10-CM | POA: Diagnosis not present

## 2021-01-16 DIAGNOSIS — M5136 Other intervertebral disc degeneration, lumbar region: Secondary | ICD-10-CM | POA: Diagnosis not present

## 2021-01-16 DIAGNOSIS — M48062 Spinal stenosis, lumbar region with neurogenic claudication: Secondary | ICD-10-CM | POA: Diagnosis not present

## 2021-01-16 DIAGNOSIS — M5416 Radiculopathy, lumbar region: Secondary | ICD-10-CM | POA: Diagnosis not present

## 2021-01-16 DIAGNOSIS — M5126 Other intervertebral disc displacement, lumbar region: Secondary | ICD-10-CM | POA: Diagnosis not present

## 2021-03-07 DIAGNOSIS — M48062 Spinal stenosis, lumbar region with neurogenic claudication: Secondary | ICD-10-CM | POA: Diagnosis not present

## 2021-03-21 DIAGNOSIS — M48062 Spinal stenosis, lumbar region with neurogenic claudication: Secondary | ICD-10-CM | POA: Diagnosis not present

## 2021-03-21 DIAGNOSIS — M5416 Radiculopathy, lumbar region: Secondary | ICD-10-CM | POA: Diagnosis not present

## 2021-03-21 DIAGNOSIS — M5136 Other intervertebral disc degeneration, lumbar region: Secondary | ICD-10-CM | POA: Diagnosis not present

## 2021-03-21 DIAGNOSIS — M47816 Spondylosis without myelopathy or radiculopathy, lumbar region: Secondary | ICD-10-CM | POA: Diagnosis not present

## 2021-05-22 ENCOUNTER — Other Ambulatory Visit: Payer: Self-pay | Admitting: Obstetrics and Gynecology

## 2021-05-22 DIAGNOSIS — Z1231 Encounter for screening mammogram for malignant neoplasm of breast: Secondary | ICD-10-CM

## 2021-06-11 DIAGNOSIS — M5416 Radiculopathy, lumbar region: Secondary | ICD-10-CM | POA: Diagnosis not present

## 2021-06-18 DIAGNOSIS — E559 Vitamin D deficiency, unspecified: Secondary | ICD-10-CM | POA: Diagnosis not present

## 2021-06-18 DIAGNOSIS — E785 Hyperlipidemia, unspecified: Secondary | ICD-10-CM | POA: Diagnosis not present

## 2021-06-25 DIAGNOSIS — R82998 Other abnormal findings in urine: Secondary | ICD-10-CM | POA: Diagnosis not present

## 2021-06-25 DIAGNOSIS — Z1212 Encounter for screening for malignant neoplasm of rectum: Secondary | ICD-10-CM | POA: Diagnosis not present

## 2021-06-25 DIAGNOSIS — E785 Hyperlipidemia, unspecified: Secondary | ICD-10-CM | POA: Diagnosis not present

## 2021-06-25 DIAGNOSIS — F418 Other specified anxiety disorders: Secondary | ICD-10-CM | POA: Diagnosis not present

## 2021-06-25 DIAGNOSIS — Z79899 Other long term (current) drug therapy: Secondary | ICD-10-CM | POA: Diagnosis not present

## 2021-06-25 DIAGNOSIS — Z0000 Encounter for general adult medical examination without abnormal findings: Secondary | ICD-10-CM | POA: Diagnosis not present

## 2021-07-02 ENCOUNTER — Ambulatory Visit
Admission: RE | Admit: 2021-07-02 | Discharge: 2021-07-02 | Disposition: A | Discharge status: Home / Self Care | Payer: Medicare Other | Source: Ambulatory Visit | Attending: Obstetrics and Gynecology | Admitting: Obstetrics and Gynecology

## 2021-07-02 ENCOUNTER — Other Ambulatory Visit: Payer: Self-pay

## 2021-07-02 DIAGNOSIS — Z1231 Encounter for screening mammogram for malignant neoplasm of breast: Secondary | ICD-10-CM

## 2021-07-03 DIAGNOSIS — M5126 Other intervertebral disc displacement, lumbar region: Secondary | ICD-10-CM | POA: Diagnosis not present

## 2021-07-03 DIAGNOSIS — Z6827 Body mass index (BMI) 27.0-27.9, adult: Secondary | ICD-10-CM | POA: Diagnosis not present

## 2021-07-03 DIAGNOSIS — M5136 Other intervertebral disc degeneration, lumbar region: Secondary | ICD-10-CM | POA: Diagnosis not present

## 2021-08-01 DIAGNOSIS — M6289 Other specified disorders of muscle: Secondary | ICD-10-CM | POA: Diagnosis not present

## 2021-08-01 DIAGNOSIS — M6281 Muscle weakness (generalized): Secondary | ICD-10-CM | POA: Diagnosis not present

## 2021-08-01 DIAGNOSIS — M62838 Other muscle spasm: Secondary | ICD-10-CM | POA: Diagnosis not present

## 2021-08-01 DIAGNOSIS — N3946 Mixed incontinence: Secondary | ICD-10-CM | POA: Diagnosis not present

## 2021-08-16 DIAGNOSIS — M6281 Muscle weakness (generalized): Secondary | ICD-10-CM | POA: Diagnosis not present

## 2021-08-16 DIAGNOSIS — N3946 Mixed incontinence: Secondary | ICD-10-CM | POA: Diagnosis not present

## 2021-08-16 DIAGNOSIS — R278 Other lack of coordination: Secondary | ICD-10-CM | POA: Diagnosis not present

## 2021-08-16 DIAGNOSIS — M62838 Other muscle spasm: Secondary | ICD-10-CM | POA: Diagnosis not present

## 2021-09-01 IMAGING — CT CT HEART SCORING
3 series · 13 of 20 positions shown, 15 images · non-contrast
Comparison: None.

CLINICAL DATA: Family history of heart disease. Baseline exam.

EXAM:
CT HEART FOR CALCIUM SCORING
TECHNIQUE: CT heart was performed using prospective ECG gating.
A non-contrast exam for calcium scoring was performed.
Note that this exam targets the heart and the chest was not imaged
in its entirety.

[Series 2: calcium scoring 2.00 qr36 bestdiast 71% · axial · 0.38mm/px · z∈[+1755,+1819]mm · 3 of 80 slices shown]
[im 16/80  vessel]
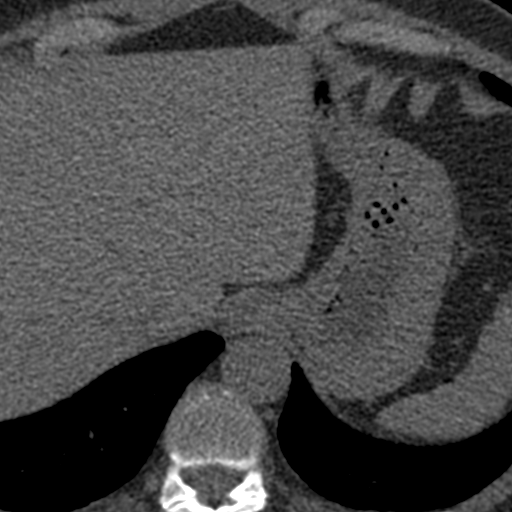
[im 32/80  vessel]
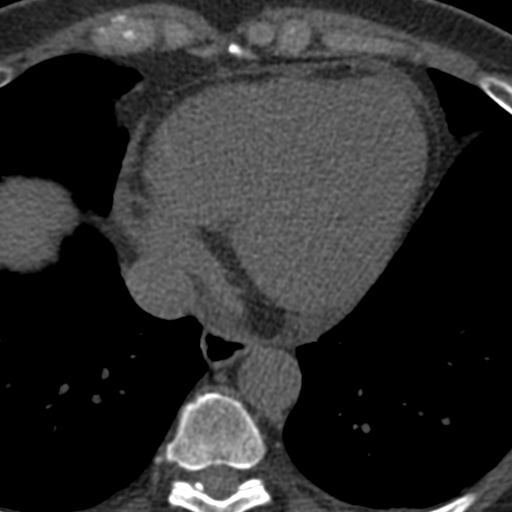
[im 48/80  vessel]
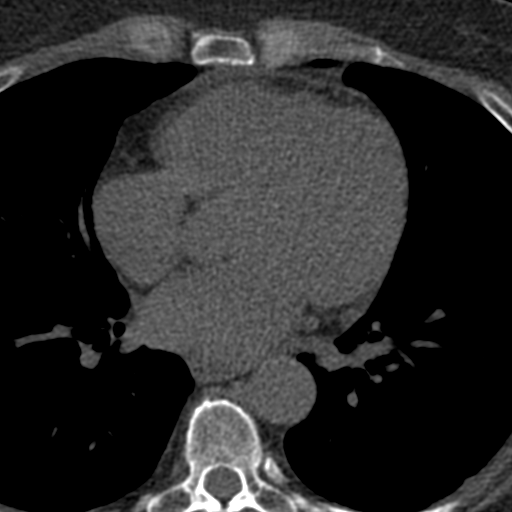

[Series 3: calcium scoring 2.00 br40 bestdiast 71% fov · axial · 0.49mm/px · z∈[+1751,+1855]mm · 5 of 80 slices shown, 7 images]
[im 14/80  vessel]
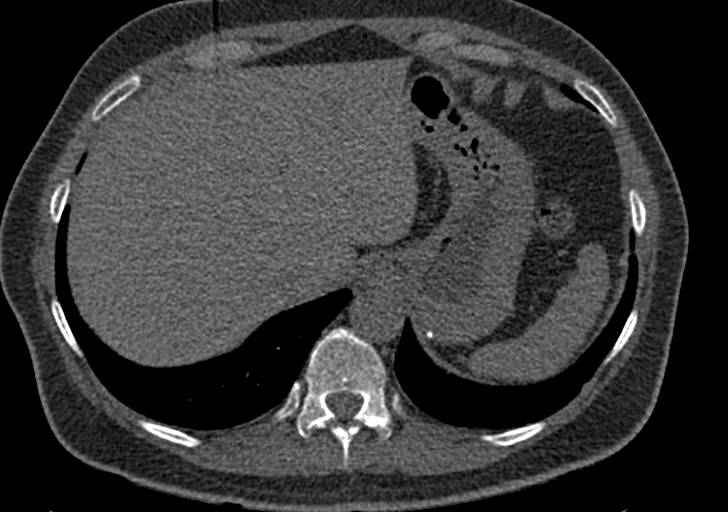
[im 14/80  lung]
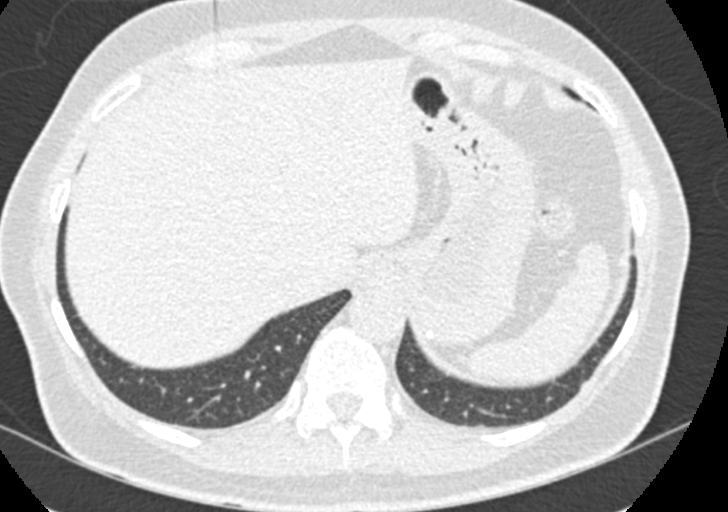
[im 27/80  vessel]
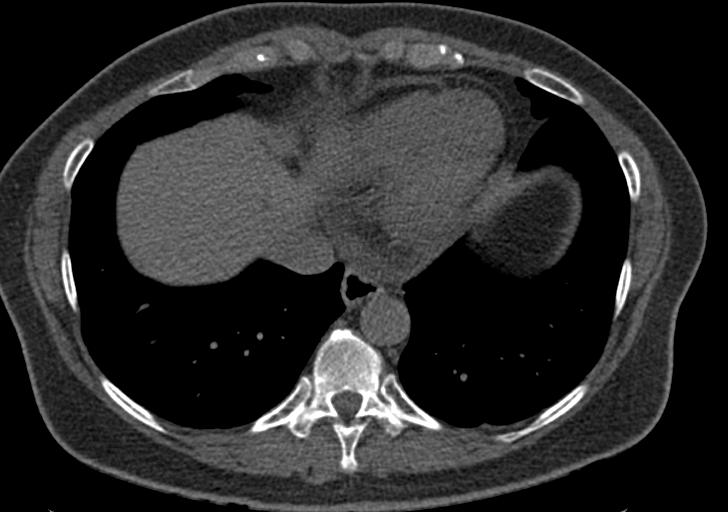
[im 40/80  vessel]
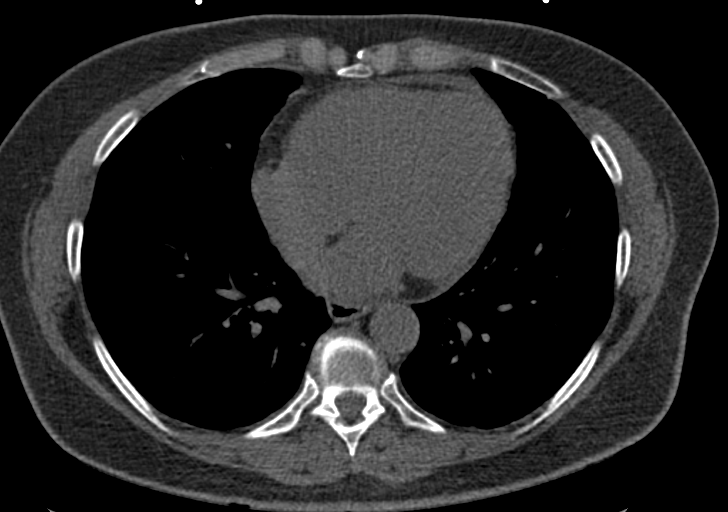
[im 53/80  vessel]
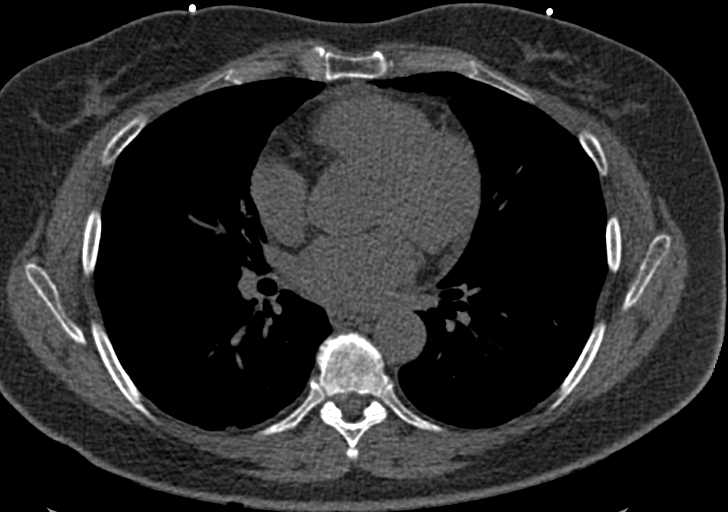
[im 66/80  vessel]
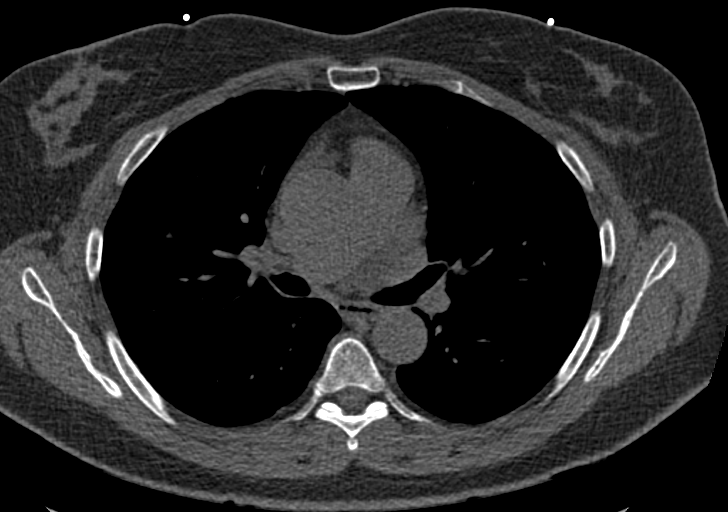
[im 66/80  lung]
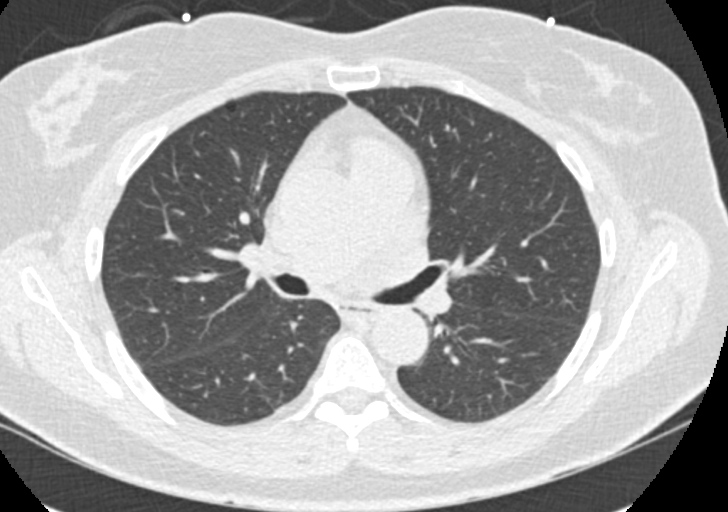

[Series 9: calcium scoring 2.00 br60 bestdiast 71% fov · axial · 0.49mm/px · z∈[+1751,+1855]mm · 5 of 80 slices shown]
[im 14/80  vessel]
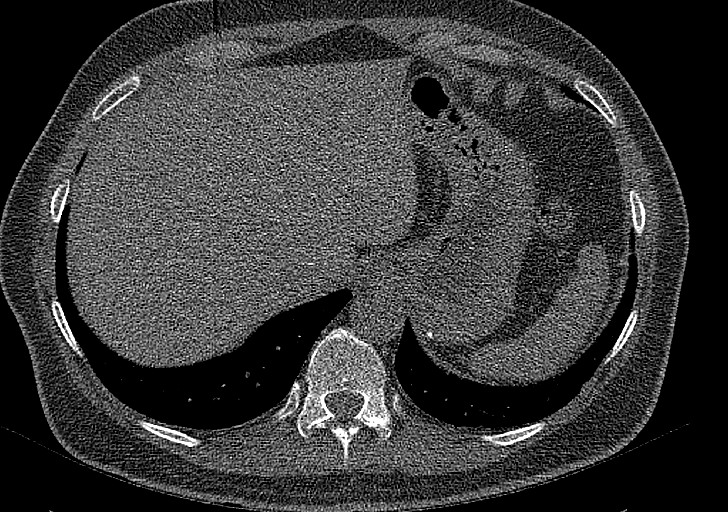
[im 27/80  vessel]
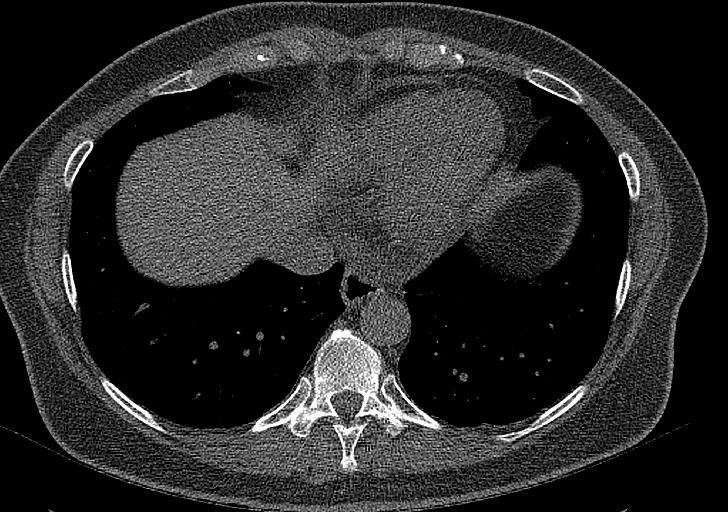
[im 40/80  vessel]
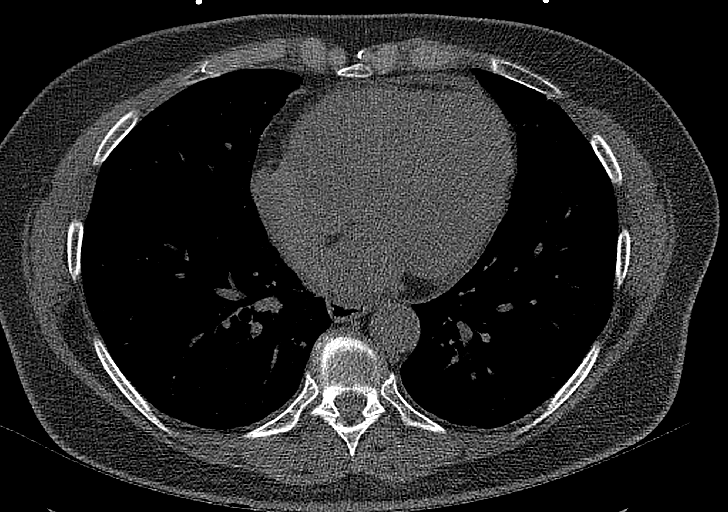
[im 53/80  vessel]
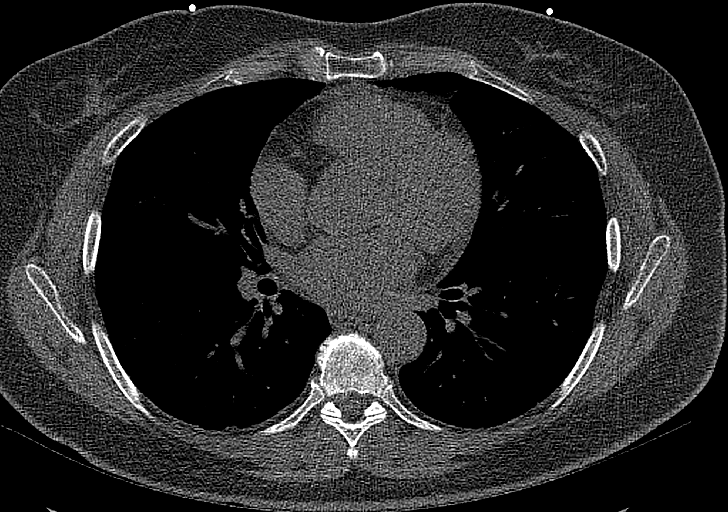
[im 66/80  vessel]
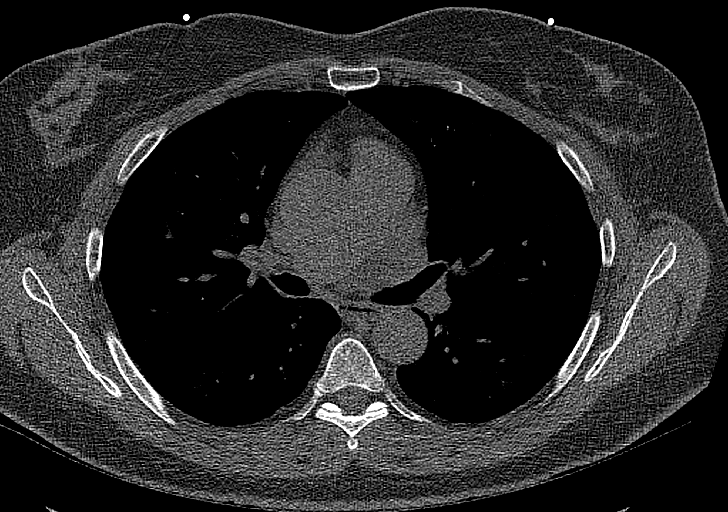

[13 of 20 positions shown; findings below may reference images not displayed]

FINDINGS: Technical quality: Good.

CORONARY CALCIUM

Total Agatston Score: 7.2-isolated mild coronary calcification
within the proximal LAD on [DATE].

[HOSPITAL] percentile:  51st

OTHER FINDINGS:

Cardiovascular: Normal aortic caliber. Mild cardiomegaly with trace
pericardial fluid or thickening, likely physiologic.

Mediastinum/Nodes: No imaged thoracic adenopathy.

Lungs/Pleura: No pleural fluid. 3 mm right lower lobe pulmonary
nodule on image 41/9.

Upper Abdomen: Left hepatic lobe 1.4 cm cyst. Normal imaged portions
of the spleen, stomach, pancreas.

Musculoskeletal: Midthoracic spondylosis.
IMPRESSION: 1. Total Agatston score of 7.2, corresponding to 51st percentile for
age, sex, and race based cohort.
2. 3 mm right lower lobe pulmonary nodule. No follow-up needed if
patient is low-risk. Non-contrast chest CT can be considered in 12
months if patient is high-risk. This recommendation follows the
consensus statement: Guidelines for Management of Incidental
Pulmonary Nodules Detected on CT Images: From the [HOSPITAL]

## 2021-09-13 DIAGNOSIS — M6289 Other specified disorders of muscle: Secondary | ICD-10-CM | POA: Diagnosis not present

## 2021-09-13 DIAGNOSIS — M6281 Muscle weakness (generalized): Secondary | ICD-10-CM | POA: Diagnosis not present

## 2021-09-13 DIAGNOSIS — M62838 Other muscle spasm: Secondary | ICD-10-CM | POA: Diagnosis not present

## 2021-09-13 DIAGNOSIS — N3946 Mixed incontinence: Secondary | ICD-10-CM | POA: Diagnosis not present

## 2021-09-17 DIAGNOSIS — M65321 Trigger finger, right index finger: Secondary | ICD-10-CM | POA: Diagnosis not present

## 2021-09-17 DIAGNOSIS — M79641 Pain in right hand: Secondary | ICD-10-CM | POA: Diagnosis not present

## 2021-09-26 DIAGNOSIS — M5416 Radiculopathy, lumbar region: Secondary | ICD-10-CM | POA: Diagnosis not present

## 2021-12-25 DIAGNOSIS — M1712 Unilateral primary osteoarthritis, left knee: Secondary | ICD-10-CM | POA: Diagnosis not present

## 2022-01-13 DIAGNOSIS — M79641 Pain in right hand: Secondary | ICD-10-CM | POA: Diagnosis not present

## 2022-01-13 DIAGNOSIS — M65321 Trigger finger, right index finger: Secondary | ICD-10-CM | POA: Diagnosis not present

## 2022-01-23 DIAGNOSIS — M5416 Radiculopathy, lumbar region: Secondary | ICD-10-CM | POA: Diagnosis not present

## 2022-03-20 DIAGNOSIS — E785 Hyperlipidemia, unspecified: Secondary | ICD-10-CM | POA: Diagnosis not present

## 2022-03-20 DIAGNOSIS — M2550 Pain in unspecified joint: Secondary | ICD-10-CM | POA: Diagnosis not present

## 2022-03-26 ENCOUNTER — Other Ambulatory Visit: Payer: Self-pay | Admitting: Nurse Practitioner

## 2022-03-26 DIAGNOSIS — N6310 Unspecified lump in the right breast, unspecified quadrant: Secondary | ICD-10-CM | POA: Diagnosis not present

## 2022-04-01 ENCOUNTER — Ambulatory Visit
Admission: RE | Admit: 2022-04-01 | Discharge: 2022-04-01 | Disposition: A | Discharge status: Home / Self Care | Payer: Medicare Other | Source: Ambulatory Visit | Attending: Nurse Practitioner | Admitting: Nurse Practitioner

## 2022-04-01 ENCOUNTER — Other Ambulatory Visit: Payer: Self-pay | Admitting: Nurse Practitioner

## 2022-04-01 DIAGNOSIS — N6313 Unspecified lump in the right breast, lower outer quadrant: Secondary | ICD-10-CM | POA: Diagnosis not present

## 2022-04-01 DIAGNOSIS — N6310 Unspecified lump in the right breast, unspecified quadrant: Secondary | ICD-10-CM

## 2022-04-01 DIAGNOSIS — R922 Inconclusive mammogram: Secondary | ICD-10-CM | POA: Diagnosis not present

## 2022-04-01 DIAGNOSIS — N6311 Unspecified lump in the right breast, upper outer quadrant: Secondary | ICD-10-CM | POA: Diagnosis not present

## 2022-04-04 DIAGNOSIS — M7711 Lateral epicondylitis, right elbow: Secondary | ICD-10-CM | POA: Diagnosis not present

## 2022-04-07 DIAGNOSIS — L821 Other seborrheic keratosis: Secondary | ICD-10-CM | POA: Diagnosis not present

## 2022-04-07 DIAGNOSIS — B078 Other viral warts: Secondary | ICD-10-CM | POA: Diagnosis not present

## 2022-05-01 DIAGNOSIS — M79641 Pain in right hand: Secondary | ICD-10-CM | POA: Diagnosis not present

## 2022-05-01 DIAGNOSIS — M7918 Myalgia, other site: Secondary | ICD-10-CM | POA: Diagnosis not present

## 2022-05-01 DIAGNOSIS — M65321 Trigger finger, right index finger: Secondary | ICD-10-CM | POA: Diagnosis not present

## 2022-05-01 DIAGNOSIS — M7711 Lateral epicondylitis, right elbow: Secondary | ICD-10-CM | POA: Diagnosis not present

## 2022-05-05 DIAGNOSIS — M5416 Radiculopathy, lumbar region: Secondary | ICD-10-CM | POA: Diagnosis not present

## 2022-05-09 DIAGNOSIS — L821 Other seborrheic keratosis: Secondary | ICD-10-CM | POA: Diagnosis not present

## 2022-05-09 DIAGNOSIS — L578 Other skin changes due to chronic exposure to nonionizing radiation: Secondary | ICD-10-CM | POA: Diagnosis not present

## 2022-05-09 DIAGNOSIS — D225 Melanocytic nevi of trunk: Secondary | ICD-10-CM | POA: Diagnosis not present

## 2022-05-09 DIAGNOSIS — L723 Sebaceous cyst: Secondary | ICD-10-CM | POA: Diagnosis not present

## 2022-05-16 DIAGNOSIS — M1712 Unilateral primary osteoarthritis, left knee: Secondary | ICD-10-CM | POA: Diagnosis not present

## 2022-05-16 DIAGNOSIS — M25562 Pain in left knee: Secondary | ICD-10-CM | POA: Diagnosis not present

## 2022-06-04 DIAGNOSIS — Z6827 Body mass index (BMI) 27.0-27.9, adult: Secondary | ICD-10-CM | POA: Diagnosis not present

## 2022-06-04 DIAGNOSIS — M5416 Radiculopathy, lumbar region: Secondary | ICD-10-CM | POA: Diagnosis not present

## 2022-06-26 DIAGNOSIS — E559 Vitamin D deficiency, unspecified: Secondary | ICD-10-CM | POA: Diagnosis not present

## 2022-06-26 DIAGNOSIS — E785 Hyperlipidemia, unspecified: Secondary | ICD-10-CM | POA: Diagnosis not present

## 2022-06-26 DIAGNOSIS — F418 Other specified anxiety disorders: Secondary | ICD-10-CM | POA: Diagnosis not present

## 2022-06-26 DIAGNOSIS — Z1212 Encounter for screening for malignant neoplasm of rectum: Secondary | ICD-10-CM | POA: Diagnosis not present

## 2022-06-26 DIAGNOSIS — R7989 Other specified abnormal findings of blood chemistry: Secondary | ICD-10-CM | POA: Diagnosis not present

## 2022-07-01 DIAGNOSIS — M8589 Other specified disorders of bone density and structure, multiple sites: Secondary | ICD-10-CM | POA: Diagnosis not present

## 2022-07-01 DIAGNOSIS — E785 Hyperlipidemia, unspecified: Secondary | ICD-10-CM | POA: Diagnosis not present

## 2022-07-01 DIAGNOSIS — J309 Allergic rhinitis, unspecified: Secondary | ICD-10-CM | POA: Diagnosis not present

## 2022-07-01 DIAGNOSIS — Z0000 Encounter for general adult medical examination without abnormal findings: Secondary | ICD-10-CM | POA: Diagnosis not present

## 2022-07-01 DIAGNOSIS — F418 Other specified anxiety disorders: Secondary | ICD-10-CM | POA: Diagnosis not present

## 2022-07-01 DIAGNOSIS — R82998 Other abnormal findings in urine: Secondary | ICD-10-CM | POA: Diagnosis not present

## 2022-07-18 DIAGNOSIS — M48061 Spinal stenosis, lumbar region without neurogenic claudication: Secondary | ICD-10-CM | POA: Diagnosis not present

## 2022-07-30 DIAGNOSIS — M4186 Other forms of scoliosis, lumbar region: Secondary | ICD-10-CM | POA: Diagnosis not present

## 2022-07-30 DIAGNOSIS — M48061 Spinal stenosis, lumbar region without neurogenic claudication: Secondary | ICD-10-CM | POA: Diagnosis not present

## 2022-07-30 DIAGNOSIS — M4726 Other spondylosis with radiculopathy, lumbar region: Secondary | ICD-10-CM | POA: Diagnosis not present

## 2022-07-30 DIAGNOSIS — M5116 Intervertebral disc disorders with radiculopathy, lumbar region: Secondary | ICD-10-CM | POA: Diagnosis not present

## 2022-08-06 DIAGNOSIS — M5416 Radiculopathy, lumbar region: Secondary | ICD-10-CM | POA: Diagnosis not present

## 2022-08-12 DIAGNOSIS — N952 Postmenopausal atrophic vaginitis: Secondary | ICD-10-CM | POA: Diagnosis not present

## 2022-08-12 DIAGNOSIS — Z01419 Encounter for gynecological examination (general) (routine) without abnormal findings: Secondary | ICD-10-CM | POA: Diagnosis not present

## 2022-08-12 DIAGNOSIS — Z124 Encounter for screening for malignant neoplasm of cervix: Secondary | ICD-10-CM | POA: Diagnosis not present

## 2022-08-12 DIAGNOSIS — Z6829 Body mass index (BMI) 29.0-29.9, adult: Secondary | ICD-10-CM | POA: Diagnosis not present

## 2022-09-05 DIAGNOSIS — M48061 Spinal stenosis, lumbar region without neurogenic claudication: Secondary | ICD-10-CM | POA: Diagnosis not present

## 2022-10-01 ENCOUNTER — Other Ambulatory Visit: Payer: Self-pay | Admitting: Obstetrics and Gynecology

## 2022-10-01 DIAGNOSIS — N6310 Unspecified lump in the right breast, unspecified quadrant: Secondary | ICD-10-CM

## 2022-10-07 ENCOUNTER — Ambulatory Visit
Admission: RE | Admit: 2022-10-07 | Discharge: 2022-10-07 | Disposition: A | Discharge status: Home / Self Care | Payer: Medicare Other | Source: Ambulatory Visit | Attending: Nurse Practitioner | Admitting: Nurse Practitioner

## 2022-10-07 ENCOUNTER — Other Ambulatory Visit: Payer: Self-pay | Admitting: Obstetrics and Gynecology

## 2022-10-07 DIAGNOSIS — N6313 Unspecified lump in the right breast, lower outer quadrant: Secondary | ICD-10-CM | POA: Diagnosis not present

## 2022-10-07 DIAGNOSIS — N6310 Unspecified lump in the right breast, unspecified quadrant: Secondary | ICD-10-CM

## 2022-10-07 DIAGNOSIS — N6311 Unspecified lump in the right breast, upper outer quadrant: Secondary | ICD-10-CM | POA: Diagnosis not present

## 2022-10-10 DIAGNOSIS — M48061 Spinal stenosis, lumbar region without neurogenic claudication: Secondary | ICD-10-CM | POA: Diagnosis not present

## 2022-10-14 ENCOUNTER — Ambulatory Visit
Admission: RE | Admit: 2022-10-14 | Discharge: 2022-10-14 | Disposition: A | Discharge status: Home / Self Care | Payer: Medicare Other | Source: Ambulatory Visit | Attending: Obstetrics and Gynecology | Admitting: Obstetrics and Gynecology

## 2022-10-14 ENCOUNTER — Other Ambulatory Visit: Payer: Medicare Other

## 2022-10-14 DIAGNOSIS — N6310 Unspecified lump in the right breast, unspecified quadrant: Secondary | ICD-10-CM

## 2022-10-14 DIAGNOSIS — N6311 Unspecified lump in the right breast, upper outer quadrant: Secondary | ICD-10-CM | POA: Diagnosis not present

## 2022-10-14 DIAGNOSIS — N61 Mastitis without abscess: Secondary | ICD-10-CM | POA: Diagnosis not present

## 2022-10-14 HISTORY — PX: BREAST BIOPSY: SHX20

## 2022-11-04 DIAGNOSIS — K080 Exfoliation of teeth due to systemic causes: Secondary | ICD-10-CM | POA: Diagnosis not present

## 2022-11-07 DIAGNOSIS — H524 Presbyopia: Secondary | ICD-10-CM | POA: Diagnosis not present

## 2022-11-10 ENCOUNTER — Ambulatory Visit: Payer: Medicare Other | Admitting: Psychology

## 2022-11-10 DIAGNOSIS — Z01818 Encounter for other preprocedural examination: Secondary | ICD-10-CM | POA: Diagnosis not present

## 2022-11-10 DIAGNOSIS — R7989 Other specified abnormal findings of blood chemistry: Secondary | ICD-10-CM | POA: Diagnosis not present

## 2022-11-10 DIAGNOSIS — Z01812 Encounter for preprocedural laboratory examination: Secondary | ICD-10-CM | POA: Diagnosis not present

## 2022-11-10 DIAGNOSIS — F4325 Adjustment disorder with mixed disturbance of emotions and conduct: Secondary | ICD-10-CM

## 2022-11-10 DIAGNOSIS — M48061 Spinal stenosis, lumbar region without neurogenic claudication: Secondary | ICD-10-CM | POA: Diagnosis not present

## 2022-11-10 NOTE — Progress Notes (Signed)
[facility] Counselor Initial Adult Exam  Name: Denise Chambers Date: 11/10/2022 MRN: [id number] DOB: 08/24/1956 PCP: Shon Baton, MD  Time Spent: 3:00  pm - 4:00 pm: 46 Minutes   [place] participated from home, via video, and consented to treatment. Therapist participated from home office. We met online due to [place] pandemic.   Guardian/Payee:  N/A    Paperwork requested: No   Reason for Visit /Presenting Problem: Difficult family dynamic with oldest daughter  Mental Status Exam: Appearance:   Casual     Behavior:  Appropriate  Motor:  Normal  Speech/Language:   Normal Rate  Affect:  Appropriate  Mood:  normal  Thought process:  normal  Thought content:    WNL  Sensory/Perceptual disturbances:    WNL  Orientation:  oriented to person, place, and situation  Attention:  Good  Concentration:  Good  Memory:  WNL  Fund of knowledge:   Good  Insight:    Good  Judgment:   Good  Impulse Control:  Good     Reported Symptoms:  anxiety/worry, agitation  Risk Assessment: Danger to Self:  No Self-injurious Behavior: No Danger to Others: No Duty to Warn:no Physical Aggression / Violence:No  Access to Firearms a concern: No  Gang Involvement:No  Patient / guardian was educated about steps to take if suicide or homicide risk level increases between visits: n/a While future psychiatric events cannot be accurately predicted, the patient does not currently require acute inpatient psychiatric care and does not currently meet [facility] involuntary commitment criteria.  Substance Abuse History: Current substance abuse: No     Past Psychiatric History:   No previous psychological problems have been observed Outpatient Providers:N/A History of Psych Hospitalization: No  Psychological Testing:  N/A    Abuse History:  Victim of: No.,  N/A    Report needed: No. Victim of Neglect:No. Perpetrator of  N/a   Witness /  Exposure to Domestic Violence: No   Protective Services Involvement: No  Witness to [employer] Violence:  No   Family History:  Family History  Problem Relation Age of Onset   Breast cancer Maternal Aunt     Living situation: the patient lives with their spouse  Sexual Orientation: Straight  Relationship Status: married  Name of spouse / other:Denise Chambers If a parent, number of children / ages:1 biological daughter, early 67's  Support Systems: spouse  Museum/gallery curator Stress:  No   Income/Employment/Disability: Employment  Armed forces logistics/support/administrative officer:  unknown  Educational History: Education:  unknown  Religion/Sprituality/World View: Jewish  Any cultural differences that may affect / interfere with treatment:  not applicable   Recreation/Hobbies: unknown  Stressors: Marital or family conflict    Strengths: Supportive Relationships, Family, and Friends  Barriers:  Unknown   Legal History: Pending legal issue / charges: The patient has no significant history of legal issues. History of legal issue / charges:  N/A  Medical History/Surgical History: not reviewed No past medical history on file.  Past Surgical History:  Procedure Laterality Date   BREAST BIOPSY Right 10/14/2022   [place] RT BREAST BX W LOC DEV 1ST LESION IMG BX SPEC US GUIDE 10/14/2022 [facility] MAMMOGRAPHY    Medications: Current Outpatient Medications  Medication Sig Dispense Refill   meloxicam (MOBIC) 15 MG tablet meloxicam 15 mg tablet  TAKE 1 TABLET BY MOUTH EVERY DAY AS NEEDED     mesalamine (APRISO)  0.375 g 24 hr capsule Apriso 0.375 gram capsule,extended release     valACYclovir (VALTREX) 1000 MG tablet valacyclovir 1 gram tablet     No current facility-administered medications for this visit.    Allergies  Allergen Reactions   Sulfa Antibiotics Rash and Other (See Comments)    hallucinations   Intitial session: Couple: They say they are frustrated with her daughter (and Denise Chambers's step-daughter) because she is  "pro-[place]. She lives in [name] with three children. [name] is very vocal with them about her political views. She is not very compassionate with her mother. Capri felt that her daughter was moving away from them the past year, even before October 7th. They fear that they may be alienated from their grandchildren. Her biological father is an alcoholic and was never involved in her life. There were withdrawal signs before October 7th. They asked about this and she said she needed boundaries. They love their son in law, but lately he is more guarded. They say that [name] "likes control" and blame others. She has never been easy. Until recently, the conversation would end, kiss and move on. The grands are 10, 8 and 6. She states that she grew up very open with thoughts and feelings. They are not sure if [name]'s positions are related to issues with Gabrielle. She had blame for Esma about her father, but in later years it turned to empathy. They have tried to do things together and she rejects their efforts. They all walk on eggshells. Their middle child was born a girl, who at age 66 decided he was a boy and now at age 21 is living as a boy. Goals: Patient is seeking a strategy to effectively communicated with her daughter regarding a variety of issues, both internal and external to the family. She wants to avoid further conflict that may impair family relations. Also hoping to gain insight/understanding to some longer term underlying issues that may be driving daughter's current behaviors. Goal date is 12-24.       Diagnoses:  Anxiety Disorder with mixed moods/conduct  Plan of Care: Outpatient psychotherapy/family therapy   Marcelina Morel, PhD

## 2022-11-19 ENCOUNTER — Ambulatory Visit: Payer: Medicare Other | Admitting: Psychology

## 2022-11-19 DIAGNOSIS — F4325 Adjustment disorder with mixed disturbance of emotions and conduct: Secondary | ICD-10-CM | POA: Diagnosis not present

## 2022-11-19 NOTE — Progress Notes (Signed)
[facility] Counselor Initial Adult Exam  Name: Denise Chambers Date: 11/19/2022 MRN: [id number] DOB: 1955-12-14 PCP: Shon Baton, MD     [place] participated from home, via video, and consented to treatment. Therapist participated from home office. We met online due to Denise Chambers pandemic.   Guardian/Payee:  N/A    Paperwork requested: No   Reason for Visit /Presenting Problem: Difficult family dynamic with oldest daughter  Mental Status Exam: Appearance:   Casual     Behavior:  Appropriate  Motor:  Normal  Speech/Language:   Normal Rate  Affect:  Appropriate  Mood:  normal  Thought process:  normal  Thought content:    WNL  Sensory/Perceptual disturbances:    WNL  Orientation:  oriented to person, place, and situation  Attention:  Good  Concentration:  Good  Memory:  WNL  Fund of knowledge:   Good  Insight:    Good  Judgment:   Good  Impulse Control:  Good     Reported Symptoms:  anxiety/worry, agitation  Risk Assessment: Danger to Self:  No Self-injurious Behavior: No Danger to Others: No Duty to Warn:no Physical Aggression / Violence:No  Access to Firearms a concern: No  Gang Involvement:No  Patient / guardian was educated about steps to take if suicide or homicide risk level increases between visits: n/a While future psychiatric events cannot be accurately predicted, the patient does not currently require acute inpatient psychiatric care and does not currently meet [facility] involuntary commitment criteria.  Substance Abuse History: Current substance abuse: No     Past Psychiatric History:   No previous psychological problems have been observed Outpatient Providers:N/A History of Psych Hospitalization: No  Psychological Testing:  N/A    Abuse History:  Victim of: No.,  N/A    Report needed: No. Victim of Neglect:No. Perpetrator of  N/a   Witness / Exposure to Domestic Violence: No   Protective Services  Involvement: No  Witness to [employer] Violence:  No   Family History:  Family History  Problem Relation Age of Onset   Breast cancer Maternal Aunt     Living situation: the patient lives with their spouse  Sexual Orientation: Straight  Relationship Status: married  Name of spouse / other:Denise Chambers If a parent, number of children / ages:1 biological daughter, early 1's  Support Systems: spouse  Museum/gallery curator Stress:  No   Income/Employment/Disability: Employment  Armed forces logistics/support/administrative officer:  unknown  Educational History: Education:  unknown  Religion/Sprituality/World View: Jewish  Any cultural differences that may affect / interfere with treatment:  not applicable   Recreation/Hobbies: unknown  Stressors: Marital or family conflict    Strengths: Supportive Relationships, Family, and Friends  Barriers:  Unknown   Legal History: Pending legal issue / charges: The patient has no significant history of legal issues. History of legal issue / charges:  N/A  Medical History/Surgical History: not reviewed No past medical history on file.  Past Surgical History:  Procedure Laterality Date   BREAST BIOPSY Right 10/14/2022   [place] RT BREAST BX W LOC DEV 1ST LESION IMG BX SPEC US GUIDE 10/14/2022 [facility] MAMMOGRAPHY    Medications: Current Outpatient Medications  Medication Sig Dispense Refill   meloxicam (MOBIC) 15 MG tablet meloxicam 15 mg tablet  TAKE 1 TABLET BY MOUTH EVERY DAY AS NEEDED     mesalamine (APRISO) 0.375 g 24 hr capsule Apriso 0.375 gram capsule,extended release     valACYclovir (VALTREX) 1000 MG tablet valacyclovir 1 gram  tablet     No current facility-administered medications for this visit.    Allergies  Allergen Reactions   Sulfa Antibiotics Rash and Other (See Comments)    hallucinations   Intitial session: Couple: They say they are frustrated with her daughter (and Denise Chambers's step-daughter) because she is "pro-[place]. She lives in [name] with three children.  Denise Chambers is very vocal with them about her political views. She is not very compassionate with her mother. Denise Chambers felt that her daughter was moving away from them the past year, even before October 7th. They fear that they may be alienated from their grandchildren. Her biological father is an alcoholic and was never involved in her life. There were withdrawal signs before October 7th. They asked about this and she said she needed boundaries. They love their son in law, but lately he is more guarded. They say that Denise Chambers "likes control" and blame others. She has never been easy. Until recently, the conversation would end, kiss and move on. The grands are 10, 8 and 6. She states that she grew up very open with thoughts and feelings. They are not sure if Denise Chambers's positions are related to issues with Denise Chambers. She had blame for Denise Chambers about her father, but in later years it turned to empathy. They have tried to do things together and she rejects their efforts. They all walk on eggshells. Their middle child was born a girl, who at age 46 decided he was a boy and now at age 74 is living as a boy. Goals: Patient is seeking a strategy to effectively communicated with her daughter regarding a variety of issues, both internal and external to the family. She wants to avoid further conflict that may impair family relations. Also hoping to gain insight/understanding to some longer term underlying issues that may be driving daughter's current behaviors. Goal date is 12-24.  Patient is seen face to face in provider office.  Session note: She feels that her relationship with her daughter was improving until the events in the middle Denise Chambers in October. Her daughter was in Denise Chambers before coming to Denise Chambers. Covid 19 started putting up "rules" that set a tone in the relationship. When Covid 9 arrived, their daughter took a position that she was making the rules. Not sure that their daughter will be receptive to talking. We talked about reflective  listening. She is having back surgery on Monday and wants to wait to determine a plan after she is recovered.We talked about the possibility that Denise Chambers may not want to process the relationship. They may need to accept that the relationship may not improve and that will need to do whatever is necessary to maintain a relationship with their grandchildren.         Diagnoses:  Anxiety Disorder with mixed moods/conduct  Plan of Care: Outpatient psychotherapy/family therapy   Marcelina Morel, PhD 8:35a-9:30a 55 minutes

## 2022-11-24 DIAGNOSIS — Z87891 Personal history of nicotine dependence: Secondary | ICD-10-CM | POA: Diagnosis not present

## 2022-11-24 DIAGNOSIS — M5416 Radiculopathy, lumbar region: Secondary | ICD-10-CM | POA: Diagnosis not present

## 2022-11-24 DIAGNOSIS — K5190 Ulcerative colitis, unspecified, without complications: Secondary | ICD-10-CM | POA: Diagnosis not present

## 2022-11-24 DIAGNOSIS — M48062 Spinal stenosis, lumbar region with neurogenic claudication: Secondary | ICD-10-CM | POA: Diagnosis not present

## 2022-11-25 DIAGNOSIS — Z87891 Personal history of nicotine dependence: Secondary | ICD-10-CM | POA: Diagnosis not present

## 2022-11-25 DIAGNOSIS — M48061 Spinal stenosis, lumbar region without neurogenic claudication: Secondary | ICD-10-CM | POA: Diagnosis not present

## 2022-11-25 DIAGNOSIS — M48062 Spinal stenosis, lumbar region with neurogenic claudication: Secondary | ICD-10-CM | POA: Diagnosis not present

## 2022-11-25 DIAGNOSIS — M5416 Radiculopathy, lumbar region: Secondary | ICD-10-CM | POA: Diagnosis not present

## 2022-11-25 DIAGNOSIS — K5190 Ulcerative colitis, unspecified, without complications: Secondary | ICD-10-CM | POA: Diagnosis not present

## 2022-12-09 DIAGNOSIS — M6281 Muscle weakness (generalized): Secondary | ICD-10-CM | POA: Diagnosis not present

## 2022-12-10 ENCOUNTER — Ambulatory Visit: Payer: Medicare Other | Admitting: Psychology

## 2022-12-10 DIAGNOSIS — F4325 Adjustment disorder with mixed disturbance of emotions and conduct: Secondary | ICD-10-CM | POA: Diagnosis not present

## 2022-12-10 NOTE — Progress Notes (Signed)
Denise Chambers  Name: Denise Chambers Date: 12/10/2022 MRN: [id number] DOB: 07/28/1956 PCP: Denise Baton, MD     Denise Chambers participated from home, via video, and consented to treatment. Therapist participated from home office. We met online due to [place] pandemic.   Guardian/Payee:  N/A    Paperwork requested: No   Reason for Visit /Presenting Problem: Difficult family dynamic with oldest daughter  Mental Status Chambers: Appearance:   Casual     Behavior:  Appropriate  Motor:  Normal  Speech/Language:   Normal Rate  Affect:  Appropriate  Mood:  normal  Thought process:  normal  Thought content:    WNL  Sensory/Perceptual disturbances:    WNL  Orientation:  oriented to person, place, and situation  Attention:  Good  Concentration:  Good  Memory:  WNL  Fund of knowledge:   Good  Insight:    Good  Judgment:   Good  Impulse Control:  Good     Reported Symptoms:  anxiety/worry, agitation  Risk Assessment: Danger to Self:  No Self-injurious Behavior: No Danger to Others: No Duty to Warn:no Physical Aggression / Violence:No  Access to Firearms a concern: No  Gang Involvement:No  Patient / guardian was educated about steps to take if suicide or homicide risk level increases between visits: n/a While future psychiatric events cannot be accurately predicted, the patient does not currently require acute inpatient psychiatric care and does not currently meet [facility] involuntary commitment criteria.  Substance Abuse History: Current substance abuse: No     Past Psychiatric History:   No previous psychological problems have been observed Outpatient Providers:N/A History of Psych Hospitalization: No  Psychological Testing:  N/A    Abuse History:  Victim of: No.,  N/A    Report needed: No. Victim of Neglect:No. Perpetrator of  N/a   Witness / Exposure to Domestic Violence: No    Protective Services Involvement: No  Witness to [employer] Violence:  No   Family History:  Family History  Problem Relation Age of Onset   Breast cancer Maternal Aunt     Living situation: the patient lives with their spouse  Sexual Orientation: Straight  Relationship Status: married  Name of spouse / other:Denise Chambers If a parent, number of children / ages:1 biological daughter, early 39's  Support Systems: spouse  Museum/gallery curator Stress:  No   Income/Employment/Disability: Employment  Armed forces logistics/support/administrative officer:  unknown  Educational History: Education:  unknown  Religion/Sprituality/World View: Jewish  Any cultural differences that may affect / interfere with treatment:  not applicable   Recreation/Hobbies: unknown  Stressors: Marital or family conflict    Strengths: Supportive Relationships, Family, and Friends  Barriers:  Unknown   Legal History: Pending legal issue / charges: The patient has no significant history of legal issues. History of legal issue / charges:  N/A  Medical History/Surgical History: not reviewed No past medical history on file.  Past Surgical History:  Procedure Laterality Date   BREAST BIOPSY Right 10/14/2022   [place] RT BREAST BX W LOC DEV 1ST LESION IMG BX SPEC US GUIDE 10/14/2022 [facility] MAMMOGRAPHY    Medications: Current Outpatient Medications  Medication Sig Dispense Refill   meloxicam (MOBIC) 15 MG tablet meloxicam 15 mg tablet  TAKE 1 TABLET BY MOUTH EVERY DAY AS NEEDED     mesalamine (APRISO) 0.375 g 24 hr capsule Apriso 0.375  gram capsule,extended release     valACYclovir (VALTREX) 1000 MG tablet valacyclovir 1 gram tablet     No current facility-administered medications for this visit.    Allergies  Allergen Reactions   Sulfa Antibiotics Rash and Other (See Comments)    hallucinations   Intitial session: Couple: They say they are frustrated with her daughter (and Denise Chambers's step-daughter) because she is "pro-Palestinian. She lives in  [name] with three children. Denise Chambers is very vocal with them about her political views. She is not very compassionate with her mother. Denise Chambers felt that her daughter was moving away from them the past year, even before October 7th. They fear that they may be alienated from their grandchildren. Her biological father is an alcoholic and was never involved in her life. There were withdrawal signs before October 7th. They asked about this and she said she needed boundaries. They love their son in law, but lately he is more guarded. They say that Denise Chambers "likes control" and blame others. She has never been easy. Until recently, the conversation would end, kiss and move on. The grands are 10, 8 and 6. She states that she grew up very open with thoughts and feelings. They are not sure if Denise Chambers's positions are related to issues with Denise Chambers. She had blame for Denise Chambers about her father, but in later years it turned to empathy. They have tried to do things together and she rejects their efforts. They all walk on eggshells. Their middle child was born a girl, who at age 77 decided he was a boy and now at age 85 is living as a boy. Goals: Patient is seeking a strategy to effectively communicated with her daughter regarding a variety of issues, both internal and external to the family. She wants to avoid further conflict that may impair family relations. Also hoping to gain insight/understanding to some longer term underlying issues that may be driving daughter's current behaviors. Goal date is 12-24.  Patient is seen face to face in provider office.  Session note: Denise Chambers says her surgery went well. She was pleased that her daughter came to help her with recovery. She has moved to a different emotional space where she is able to focus on the positives in their relationship and to keep the relationship positive. They are not going to wait for her daughter to initiate visits and will accept that they will need to initiate most visits. We  talked about how to manage challenges regarding questions and schedules. We discussed the challenges of family vacations. They are learning to accommodate to their daughter's boundaries.             Diagnoses:  Anxiety Disorder with mixed moods/conduct  Plan of Care: Outpatient psychotherapy/family therapy   Marcelina Morel, PhD 9:40a-10:30a 55 minutes

## 2022-12-11 DIAGNOSIS — M6281 Muscle weakness (generalized): Secondary | ICD-10-CM | POA: Diagnosis not present

## 2022-12-15 DIAGNOSIS — M6281 Muscle weakness (generalized): Secondary | ICD-10-CM | POA: Diagnosis not present

## 2022-12-17 DIAGNOSIS — M6281 Muscle weakness (generalized): Secondary | ICD-10-CM | POA: Diagnosis not present

## 2022-12-19 DIAGNOSIS — M6281 Muscle weakness (generalized): Secondary | ICD-10-CM | POA: Diagnosis not present

## 2022-12-23 DIAGNOSIS — M6281 Muscle weakness (generalized): Secondary | ICD-10-CM | POA: Diagnosis not present

## 2022-12-24 ENCOUNTER — Ambulatory Visit: Payer: Medicare Other | Admitting: Psychology

## 2022-12-25 DIAGNOSIS — M6281 Muscle weakness (generalized): Secondary | ICD-10-CM | POA: Diagnosis not present

## 2022-12-30 DIAGNOSIS — M6281 Muscle weakness (generalized): Secondary | ICD-10-CM | POA: Diagnosis not present

## 2023-01-01 DIAGNOSIS — M6281 Muscle weakness (generalized): Secondary | ICD-10-CM | POA: Diagnosis not present

## 2023-01-06 DIAGNOSIS — M6281 Muscle weakness (generalized): Secondary | ICD-10-CM | POA: Diagnosis not present

## 2023-01-08 DIAGNOSIS — M6281 Muscle weakness (generalized): Secondary | ICD-10-CM | POA: Diagnosis not present

## 2023-01-09 DIAGNOSIS — M4326 Fusion of spine, lumbar region: Secondary | ICD-10-CM | POA: Diagnosis not present

## 2023-01-09 DIAGNOSIS — Z9889 Other specified postprocedural states: Secondary | ICD-10-CM | POA: Diagnosis not present

## 2023-01-13 DIAGNOSIS — M6281 Muscle weakness (generalized): Secondary | ICD-10-CM | POA: Diagnosis not present

## 2023-01-15 DIAGNOSIS — M6281 Muscle weakness (generalized): Secondary | ICD-10-CM | POA: Diagnosis not present

## 2023-01-20 DIAGNOSIS — M6281 Muscle weakness (generalized): Secondary | ICD-10-CM | POA: Diagnosis not present

## 2023-01-27 DIAGNOSIS — M6281 Muscle weakness (generalized): Secondary | ICD-10-CM | POA: Diagnosis not present

## 2023-02-03 DIAGNOSIS — M6281 Muscle weakness (generalized): Secondary | ICD-10-CM | POA: Diagnosis not present

## 2023-02-10 DIAGNOSIS — M6281 Muscle weakness (generalized): Secondary | ICD-10-CM | POA: Diagnosis not present

## 2023-02-11 ENCOUNTER — Ambulatory Visit: Payer: Medicare Other | Admitting: Psychology

## 2023-02-11 DIAGNOSIS — F4325 Adjustment disorder with mixed disturbance of emotions and conduct: Secondary | ICD-10-CM | POA: Diagnosis not present

## 2023-02-11 NOTE — Progress Notes (Signed)
[facility] Counselor Initial Adult Exam  Name: Sholonda Kimbrly Ferwerda Date: 02/11/2023 MRN: [id number] DOB: 1956-05-25 PCP: Creola Corn, MD     Joslynne Laverda Page participated from home, via video, and consented to treatment. Therapist participated from home office. We met online due to COVID pandemic.   Guardian/Payee:  N/A    Paperwork requested: No   Reason for Visit /Presenting Problem: Difficult family dynamic with oldest daughter  Mental Status Exam: Appearance:   Casual     Behavior:  Appropriate  Motor:  Normal  Speech/Language:   Normal Rate  Affect:  Appropriate  Mood:  normal  Thought process:  normal  Thought content:    WNL  Sensory/Perceptual disturbances:    WNL  Orientation:  oriented to person, place, and situation  Attention:  Good  Concentration:  Good  Memory:  WNL  Fund of knowledge:   Good  Insight:    Good  Judgment:   Good  Impulse Control:  Good     Reported Symptoms:  anxiety/worry, agitation  Risk Assessment: Danger to Self:  No Self-injurious Behavior: No Danger to Others: No Duty to Warn:no Physical Aggression / Violence:No  Access to Firearms a concern: No  Gang Involvement:No  Patient / guardian was educated about steps to take if suicide or homicide risk level increases between visits: n/a While future psychiatric events cannot be accurately predicted, the patient does not currently require acute inpatient psychiatric care and does not currently meet [facility] involuntary commitment criteria.  Substance Abuse History: Current substance abuse: No     Past Psychiatric History:   No previous psychological problems have been observed Outpatient Providers:N/A History of Psych Hospitalization: No  Psychological Testing:  N/A    Abuse History:  Victim of: No.,  N/A    Report needed: No. Victim of Neglect:No. Perpetrator of  N/a   Witness / Exposure  to Domestic Violence: No   Protective Services Involvement: No  Witness to [employer] Violence:  No   Family History:  Family History  Problem Relation Age of Onset   Breast cancer Maternal Aunt     Living situation: the patient lives with their spouse  Sexual Orientation: Straight  Relationship Status: married  Name of spouse / other:[name] If a parent, number of children / ages:1 biological daughter, early 31's  Support Systems: spouse  Surveyor, quantity Stress:  No   Income/Employment/Disability: Employment  Financial planner:  unknown  Educational History: Education:  unknown  Religion/Sprituality/World View: Jewish  Any cultural differences that may affect / interfere with treatment:  not applicable   Recreation/Hobbies: unknown  Stressors: Marital or family conflict    Strengths: Supportive Relationships, Family, and Friends  Barriers:  Unknown   Legal History: Pending legal issue / charges: The patient has no significant history of legal issues. History of legal issue / charges:  N/A  Medical History/Surgical History: not reviewed No past medical history on file.  Past Surgical History:  Procedure Laterality Date   BREAST BIOPSY Right 10/14/2022   [place] RT BREAST BX W LOC DEV 1ST LESION IMG BX SPEC US GUIDE 10/14/2022 [facility] MAMMOGRAPHY    Medications: Current Outpatient Medications  Medication Sig Dispense Refill   meloxicam (MOBIC) 15 MG tablet meloxicam 15 mg tablet  TAKE 1 TABLET BY MOUTH EVERY DAY  AS NEEDED     mesalamine (APRISO) 0.375 g 24 hr capsule Apriso 0.375 gram capsule,extended release     valACYclovir (VALTREX) 1000 MG tablet valacyclovir 1 gram tablet     No current facility-administered medications for this visit.    Allergies  Allergen Reactions   Sulfa Antibiotics Rash and Other (See Comments)    hallucinations   Intitial session: Couple: They say they are frustrated with her daughter (and [name]'s step-daughter) because she is  "pro-Palestinian. She lives in [place] with three children. [name] is very vocal with them about her political views. She is not very compassionate with her mother. Myrna felt that her daughter was moving away from them the past year, even before October 7th. They fear that they may be alienated from their grandchildren. Her biological father is an alcoholic and was never involved in her life. There were withdrawal signs before October 7th. They asked about this and she said she needed boundaries. They love their son in law, but lately he is more guarded. They say that [name] "likes control" and blame others. She has never been easy. Until recently, the conversation would end, kiss and move on. The grands are 10, 8 and 6. She states that she grew up very open with thoughts and feelings. They are not sure if [name]'s positions are related to issues with Erminie. She had blame for Tatijana about her father, but in later years it turned to empathy. They have tried to do things together and she rejects their efforts. They all walk on eggshells. Their middle child was born a girl, who at age six decided he was a boy and now at age 80 is living as a boy. Goals: Patient is seeking a strategy to effectively communicated with her daughter regarding a variety of issues, both internal and external to the family. She wants to avoid further conflict that may impair family relations. Also hoping to gain insight/understanding to some longer term underlying issues that may be driving daughter's current behaviors. Goal date is 12-24.  Patient is seen face to face in provider office.  Session note: Cara says that things are going well with her daughter and grandchildren. Today she wants to discuss her step-daughter [name]. Ofilia came into [name]'s life when she was 67 years old. She is Terri's daughter from first marriage and Corin's mother was not a good mother to her. Frida says that [name] was not easy and selfish. [name] spent  alternate weeks with them. She says that [name]'s wedding was a complete disaster. [name] wrote a long letter to her outlining all of the things that have angered and hurt her over the past 25 years. Nakari is very distressed about the accusations. The letter had many "examples" of how Shalana has let her down or rejected her. Mykaila feels these are unfair and unwarranted attacks. She has "no clue" where this is coming from and is frustrated that her step-daughter takes "no responsibility" for any of the conflict in their relationship. She is enraged with [name]. We discussed the need to not be impulsive in reacting and to take time to reflect on what is being communicated. She is willing to "step-back" and will work on a response.                Diagnoses:  Anxiety Disorder with mixed moods/conduct  Plan of Care: Outpatient psychotherapy/family therapy   Garrel Ridgel, PhD 7:35a-8:30a 55 minutes

## 2023-02-17 DIAGNOSIS — M6281 Muscle weakness (generalized): Secondary | ICD-10-CM | POA: Diagnosis not present

## 2023-02-18 ENCOUNTER — Ambulatory Visit: Payer: Medicare Other | Admitting: Psychology

## 2023-02-18 DIAGNOSIS — F4325 Adjustment disorder with mixed disturbance of emotions and conduct: Secondary | ICD-10-CM | POA: Diagnosis not present

## 2023-02-18 NOTE — Progress Notes (Signed)
[facility] Counselor Initial Adult Exam  Name: Denise Chambers Date: 02/18/2023 MRN: [id number] DOB: 13-Feb-1956 PCP: Creola Corn, MD     Emerald Laverda Page participated from home, via video, and consented to treatment. Therapist participated from home office. We met online due to COVID pandemic.   Guardian/Payee:  N/A    Paperwork requested: No   Reason for Visit /Presenting Problem: Difficult family dynamic with oldest daughter  Mental Status Exam: Appearance:   Casual     Behavior:  Appropriate  Motor:  Normal  Speech/Language:   Normal Rate  Affect:  Appropriate  Mood:  normal  Thought process:  normal  Thought content:    WNL  Sensory/Perceptual disturbances:    WNL  Orientation:  oriented to person, place, and situation  Attention:  Good  Concentration:  Good  Memory:  WNL  Fund of knowledge:   Good  Insight:    Good  Judgment:   Good  Impulse Control:  Good     Reported Symptoms:  anxiety/worry, agitation  Risk Assessment: Danger to Self:  No Self-injurious Behavior: No Danger to Others: No Duty to Warn:no Physical Aggression / Violence:No  Access to Firearms a concern: No  Gang Involvement:No  Patient / guardian was educated about steps to take if suicide or homicide risk level increases between visits: n/a While future psychiatric events cannot be accurately predicted, the patient does not currently require acute inpatient psychiatric care and does not currently meet [facility] involuntary commitment criteria.  Substance Abuse History: Current substance abuse: No     Past Psychiatric History:   No previous psychological problems have been observed Outpatient Providers:N/A History of Psych Hospitalization: No  Psychological Testing:  N/A    Abuse History:  Victim of: No.,  N/A    Report needed: No. Victim of Neglect:No. Perpetrator of  N/a   Witness /  Exposure to Domestic Violence: No   Protective Services Involvement: No  Witness to [employer] Violence:  No   Family History:  Family History  Problem Relation Age of Onset   Breast cancer Maternal Aunt     Living situation: the patient lives with their spouse  Sexual Orientation: Straight  Relationship Status: married  Name of spouse / other:[name] If a parent, number of children / ages:1 biological daughter, early 21's  Support Systems: spouse  Surveyor, quantity Stress:  No   Income/Employment/Disability: Employment  Financial planner:  unknown  Educational History: Education:  unknown  Religion/Sprituality/World View: Jewish  Any cultural differences that may affect / interfere with treatment:  not applicable   Recreation/Hobbies: unknown  Stressors: Marital or family conflict    Strengths: Supportive Relationships, Family, and Friends  Barriers:  Unknown   Legal History: Pending legal issue / charges: The patient has no significant history of legal issues. History of legal issue / charges:  N/A  Medical History/Surgical History: not reviewed No past medical history on file.  Past Surgical History:  Procedure Laterality Date   BREAST BIOPSY Right 10/14/2022   [place] RT BREAST BX W LOC DEV 1ST LESION IMG BX SPEC US GUIDE 10/14/2022 [facility] MAMMOGRAPHY    Medications: Current Outpatient Medications  Medication Sig Dispense Refill   meloxicam (MOBIC) 15 MG tablet meloxicam 15 mg tablet  TAKE 1 TABLET BY MOUTH EVERY DAY  AS NEEDED     mesalamine (APRISO) 0.375 g 24 hr capsule Apriso 0.375 gram capsule,extended release     valACYclovir (VALTREX) 1000 MG tablet valacyclovir 1 gram tablet     No current facility-administered medications for this visit.    Allergies  Allergen Reactions   Sulfa Antibiotics Rash and Other (See Comments)    hallucinations   Intitial session: Couple: They say they are frustrated with her daughter (and [name]'s step-daughter) because she is  "pro-Palestinian. She lives in [place] with three children. [name] is very vocal with them about her political views. She is not very compassionate with her mother. Beautifull felt that her daughter was moving away from them the past year, even before October 7th. They fear that they may be alienated from their grandchildren. Her biological father is an alcoholic and was never involved in her life. There were withdrawal signs before October 7th. They asked about this and she said she needed boundaries. They love their son in law, but lately he is more guarded. They say that [name] "likes control" and blame others. She has never been easy. Until recently, the conversation would end, kiss and move on. The grands are 10, 8 and 6. She states that she grew up very open with thoughts and feelings. They are not sure if [name]'s positions are related to issues with Evone. She had blame for Pollie about her father, but in later years it turned to empathy. They have tried to do things together and she rejects their efforts. They all walk on eggshells. Their middle child was born a girl, who at age six decided he was a boy and now at age 63 is living as a boy. Goals: Patient is seeking a strategy to effectively communicated with her daughter regarding a variety of issues, both internal and external to the family. She wants to avoid further conflict that may impair family relations. Also hoping to gain insight/understanding to some longer term underlying issues that may be driving daughter's current behaviors. Goal date is 12-24.  Patient is seen face to face in provider office.  Session note: (Couple): They want to discuss the response to [name]'s daughter [name] after her e-mail to [place]. [name] said that [name] is seeking an apology from [employer]. They are unclear what precipitated the letter to Ramanda or what she is asking of her. [name] is often in the middle between his daughter and wife. Icis is angry and frustrated. They scripted  a letter in response that we reviewed in session. It basically acknowledged [name]'s pain, but asked for time to process all that was said before setting up a conversation.                 Diagnoses:  Anxiety Disorder with mixed moods/conduct  Plan of Care: Outpatient psychotherapy/family therapy   Garrel Ridgel, PhD 9:40a-10:30a 55 minutes

## 2023-02-19 DIAGNOSIS — E049 Nontoxic goiter, unspecified: Secondary | ICD-10-CM | POA: Diagnosis not present

## 2023-02-20 ENCOUNTER — Other Ambulatory Visit: Payer: Self-pay | Admitting: Family Medicine

## 2023-02-20 DIAGNOSIS — E049 Nontoxic goiter, unspecified: Secondary | ICD-10-CM

## 2023-02-24 DIAGNOSIS — M6281 Muscle weakness (generalized): Secondary | ICD-10-CM | POA: Diagnosis not present

## 2023-03-03 DIAGNOSIS — M6281 Muscle weakness (generalized): Secondary | ICD-10-CM | POA: Diagnosis not present

## 2023-03-10 DIAGNOSIS — M6281 Muscle weakness (generalized): Secondary | ICD-10-CM | POA: Diagnosis not present

## 2023-03-10 DIAGNOSIS — K080 Exfoliation of teeth due to systemic causes: Secondary | ICD-10-CM | POA: Diagnosis not present

## 2023-03-20 ENCOUNTER — Ambulatory Visit
Admission: RE | Admit: 2023-03-20 | Discharge: 2023-03-20 | Disposition: A | Discharge status: Home / Self Care | Payer: Medicare Other | Source: Ambulatory Visit | Attending: Family Medicine | Admitting: Family Medicine

## 2023-03-20 DIAGNOSIS — E049 Nontoxic goiter, unspecified: Secondary | ICD-10-CM

## 2023-03-20 DIAGNOSIS — R221 Localized swelling, mass and lump, neck: Secondary | ICD-10-CM | POA: Diagnosis not present

## 2023-03-25 ENCOUNTER — Ambulatory Visit: Payer: Medicare Other | Admitting: Psychology

## 2023-03-25 DIAGNOSIS — F4325 Adjustment disorder with mixed disturbance of emotions and conduct: Secondary | ICD-10-CM

## 2023-03-25 NOTE — Progress Notes (Signed)
[facility] Counselor Initial Adult Exam  Name: Denise Chambers Date: 03/25/2023 MRN: [id number] DOB: 01-13-1956 PCP: Denise Corn, MD     Denise Chambers participated from home, via video, and consented to treatment. Therapist participated from home office. We met online due to COVID pandemic.   Guardian/Payee:  N/A    Paperwork requested: No   Reason for Visit /Presenting Problem: Difficult family dynamic with oldest daughter  Mental Status Exam: Appearance:   Casual     Behavior:  Appropriate  Motor:  Normal  Speech/Language:   Normal Rate  Affect:  Appropriate  Mood:  normal  Thought process:  normal  Thought content:    WNL  Sensory/Perceptual disturbances:    WNL  Orientation:  oriented to person, place, and situation  Attention:  Good  Concentration:  Good  Memory:  WNL  Fund of knowledge:   Good  Insight:    Good  Judgment:   Good  Impulse Control:  Good     Reported Symptoms:  anxiety/worry, agitation  Risk Assessment: Danger to Self:  No Self-injurious Behavior: No Danger to Others: No Duty to Warn:no Physical Aggression / Violence:No  Access to Firearms a concern: No  Gang Involvement:No  Patient / guardian was educated about steps to take if suicide or homicide risk level increases between visits: n/a While future psychiatric events cannot be accurately predicted, the patient does not currently require acute inpatient psychiatric care and does not currently meet [facility] involuntary commitment criteria.  Substance Abuse History: Current substance abuse: No     Past Psychiatric History:   No previous psychological problems have been observed Outpatient Providers:N/A History of Psych Hospitalization: No  Psychological Testing:  N/A    Abuse History:  Victim of: No.,  N/A    Report needed: No. Victim of  Neglect:No. Perpetrator of  N/a   Witness / Exposure to Domestic Violence: No   Protective Services Involvement: No  Witness to [employer] Violence:  No   Family History:  Family History  Problem Relation Age of Onset   Breast cancer Maternal Aunt     Living situation: the patient lives with their spouse  Sexual Orientation: Straight  Relationship Status: married  Name of spouse / other:Denise Chambers If a parent, number of children / ages:1 biological daughter, early 50's  Support Systems: spouse  Surveyor, quantity Stress:  No   Income/Employment/Disability: Employment  Financial planner:  unknown  Educational History: Education:  unknown  Religion/Sprituality/World View: Jewish  Any cultural differences that may affect / interfere with treatment:  not applicable   Recreation/Hobbies: unknown  Stressors: Marital or family conflict    Strengths: Supportive Relationships, Family, and Friends  Barriers:  Unknown   Legal History: Pending legal issue / charges: The patient has no significant history of legal issues. History of legal issue / charges:  N/A  Medical History/Surgical History: not reviewed No past medical history on file.  Past Surgical History:  Procedure Laterality Date   BREAST BIOPSY Right 10/14/2022   [place] RT BREAST BX W LOC DEV 1ST LESION IMG BX SPEC US GUIDE 10/14/2022 [facility] MAMMOGRAPHY    Medications: Current Outpatient Medications  Medication Sig Dispense Refill   meloxicam (MOBIC)  15 MG tablet meloxicam 15 mg tablet  TAKE 1 TABLET BY MOUTH EVERY DAY AS NEEDED     mesalamine (APRISO) 0.375 g 24 hr capsule Apriso 0.375 gram capsule,extended release     valACYclovir (VALTREX) 1000 MG tablet valacyclovir 1 gram tablet     No current facility-administered medications for this visit.    Allergies  Allergen Reactions   Sulfa Antibiotics Rash and Other (See Comments)    hallucinations   Intitial session: Couple: They say they are frustrated with her  daughter (and Denise Chambers's step-daughter) because she is "pro-Palestinian. She lives in [place] with three children. Denise Chambers is very vocal with them about her political views. She is not very compassionate with her mother. Denise Chambers felt that her daughter was moving away from them the past year, even before October 7th. They fear that they may be alienated from their grandchildren. Her biological father is an alcoholic and was never involved in her life. There were withdrawal signs before October 7th. They asked about this and she said she needed boundaries. They love their son in law, but lately he is more guarded. They say that Denise Chambers "likes control" and blame others. She has never been easy. Until recently, the conversation would end, kiss and move on. The grands are 10, 8 and 6. She states that she grew up very open with thoughts and feelings. They are not sure if Denise Chambers's positions are related to issues with Denise Chambers. She had blame for Denise Chambers about her father, but in later years it turned to empathy. They have tried to do things together and she rejects their efforts. They all walk on eggshells. Their middle child was born a girl, who at age [age] decided he was a boy and now at age 51 is living as a boy. Goals: Patient is seeking a strategy to effectively communicated with her daughter regarding a variety of issues, both internal and external to the family. She wants to avoid further conflict that may impair family relations. Also hoping to gain insight/understanding to some longer term underlying issues that may be driving daughter's current behaviors. Goal date is 12-24.  Patient is seen face to face in provider office.  Session note: (Couple): [name] responded to the letter with "sounds good". Denise Chambers has been speaking to her weekly and it goes well. We discussed whether she should go with Denise Chambers and his parents to visit Denise Chambers and the baby this weekend. I encouraged her to attend and focus on the positive. She struggles to "put  on a face", and does not want to be "fake". She is willing to consider going and focus on things that are good. We also talked about shifting expectations in their relationships. After the weekend, we will meet and begin to process how [name] feels and what he wants to say and work on in her relationship with [name].                 Diagnoses:  Anxiety Disorder with mixed moods/conduct  Plan of Care: Outpatient psychotherapy/family therapy   Garrel Ridgel, PhD 7:40a-8:30a 50 minutes

## 2023-03-31 DIAGNOSIS — M6281 Muscle weakness (generalized): Secondary | ICD-10-CM | POA: Diagnosis not present

## 2023-04-01 ENCOUNTER — Ambulatory Visit: Payer: Medicare Other | Admitting: Psychology

## 2023-04-01 DIAGNOSIS — F4325 Adjustment disorder with mixed disturbance of emotions and conduct: Secondary | ICD-10-CM | POA: Diagnosis not present

## 2023-04-01 NOTE — Progress Notes (Signed)
[facility] Counselor Initial Adult Exam  Name: Denise Chambers Date: 04/01/2023 MRN: [id number] DOB: [birth year] PCP: Creola Corn, MD     Denise Chambers Page participated from home, via video, and consented to treatment. Therapist participated from home office. We met online due to COVID pandemic.   Guardian/Payee:  N/A    Paperwork requested: No   Reason for Visit /Presenting Problem: Difficult family dynamic with oldest daughter  Mental Status Exam: Appearance:   Casual     Behavior:  Appropriate  Motor:  Normal  Speech/Language:   Normal Rate  Affect:  Appropriate  Mood:  normal  Thought process:  normal  Thought content:    WNL  Sensory/Perceptual disturbances:    WNL  Orientation:  oriented to person, place, and situation  Attention:  Good  Concentration:  Good  Memory:  WNL  Fund of knowledge:   Good  Insight:    Good  Judgment:   Good  Impulse Control:  Good     Reported Symptoms:  anxiety/worry, agitation  Risk Assessment: Danger to Self:  No Self-injurious Behavior: No Danger to Others: No Duty to Warn:no Physical Aggression / Violence:No  Access to Firearms a concern: No  Gang Involvement:No  Patient / guardian was educated about steps to take if suicide or homicide risk level increases between visits: n/a While future psychiatric events cannot be accurately predicted, the patient does not currently require acute inpatient psychiatric care and does not currently meet [facility] involuntary commitment criteria.  Substance Abuse History: Current substance abuse: No     Past Psychiatric History:   No previous psychological problems have been observed Outpatient Providers:N/A History of Psych Hospitalization: No  Psychological Testing:  N/A    Abuse History:  Victim of: No.,  N/A    Report needed:  No. Victim of Neglect:No. Perpetrator of  N/a   Witness / Exposure to Domestic Violence: No   Protective Services Involvement: No  Witness to [employer] Violence:  No   Family History:  Family History  Problem Relation Age of Onset   Breast cancer Maternal Aunt     Living situation: the patient lives with their spouse  Sexual Orientation: Straight  Relationship Status: married  Name of spouse / other:Denise Chambers If a parent, number of children / ages:1 biological daughter, early 85's  Support Systems: spouse  Surveyor, quantity Stress:  No   Income/Employment/Disability: Employment  Financial planner:  unknown  Educational History: Education:  unknown  Religion/Sprituality/World View: Jewish  Any cultural differences that may affect / interfere with treatment:  not applicable   Recreation/Hobbies: unknown  Stressors: Marital or family conflict    Strengths: Supportive Relationships, Family, and Friends  Barriers:  Unknown   Legal History: Pending legal issue / charges: The patient has no significant history of legal issues. History of legal issue / charges:  N/A  Medical History/Surgical History: not reviewed No past medical history on file.  Past Surgical History:  Procedure Laterality Date   BREAST BIOPSY Right 10/14/2022   [place] RT BREAST BX W LOC DEV 1ST LESION IMG BX SPEC US GUIDE 10/14/2022 [facility] MAMMOGRAPHY  Medications: Current Outpatient Medications  Medication Sig Dispense Refill   meloxicam (MOBIC) 15 MG tablet meloxicam 15 mg tablet  TAKE 1 TABLET BY MOUTH EVERY DAY AS NEEDED     mesalamine (APRISO) 0.375 g 24 hr capsule Apriso 0.375 gram capsule,extended release     valACYclovir (VALTREX) 1000 MG tablet valacyclovir 1 gram tablet     No current facility-administered medications for this visit.    Allergies  Allergen Reactions   Sulfa Antibiotics Rash and Other (See Comments)    hallucinations   Intitial session: Couple: They say they are frustrated  with her daughter (and Denise Chambers's step-daughter) because she is "pro-[place]inian. She lives in [place] with three children. Denise Chambers is very vocal with them about her political views. She is not very compassionate with her mother. Denise Chambers felt that her daughter was moving away from them the past year, even before October 7th. They fear that they may be alienated from their grandchildren. Her biological father is an alcoholic and was never involved in her life. There were withdrawal signs before October 7th. They asked about this and she said she needed boundaries. They love their son in law, but lately he is more guarded. They say that Denise Chambers "likes control" and blame others. She has never been easy. Until recently, the conversation would end, kiss and move on. The grands are 10, 8 and 6. She states that she grew up very open with thoughts and feelings. They are not sure if Denise Chambers's positions are related to issues with Denise Chambers. She had blame for Denise Chambers about her father, but in later years it turned to empathy. They have tried to do things together and she rejects their efforts. They all walk on eggshells. Their middle child was born a girl, who at age six decided he was a boy and now at age 14 is living as a boy. Goals: Patient is seeking a strategy to effectively communicated with her daughter regarding a variety of issues, both internal and external to the family. She wants to avoid further conflict that may impair family relations. Also hoping to gain insight/understanding to some longer term underlying issues that may be driving daughter's current behaviors. Goal date is 12-24.  Patient is seen face to face in provider office.  Session note: (Couple): Denise Chambers told Denise Chambers that she did not want Denise Chambers to come for the visit until they have "talked it out". Denise Chambers was was reluctant to go even before. She is more upset with Denise Chambers and feeling rejected. She feels that Denise Chambers is wanting her to call Denise Chambers. She also feels that Denise Chambers  is disrespectful and "demanding" that Denise Chambers be the one to reach out. She is questioning whether to respond and whether she wants a relationship with Denise Chambers.  Denise Chambers doesn't feel Denise Chambers has her back 100%. He doesn't fel she isn't 100% supportive of him.  Need to discuss further next session. Denise Chambers appears very frustrated and annoyed at times given the impasse between [place]. His "role" is fragile in this dynamic and it upsets Elvis that Denise Chambers doesn't stand up for her in his conversations with his daughter. Arcenia is frustrated that it feels all expectations are on her to fix the relationship with Denise Chambers. Told her it does need to be sooner than later.                  Diagnoses:  Anxiety Disorder with mixed moods/conduct  Plan of Care: Outpatient psychotherapy/family therapy   Garrel Ridgel, PhD 7:40a-8:30a 50 minutes

## 2023-04-15 ENCOUNTER — Ambulatory Visit: Payer: Medicare Other | Admitting: Psychology

## 2023-04-15 DIAGNOSIS — F4325 Adjustment disorder with mixed disturbance of emotions and conduct: Secondary | ICD-10-CM

## 2023-04-15 NOTE — Progress Notes (Signed)
[facility] Initial Adult Exam  Name: Denise Chambers Date: 04/15/2023 MRN: [id number] DOB: Jun 03, 1956 PCP: Denise Corn, MD     Denise Chambers participated from home, via video, and consented to treatment. Therapist participated from home office. We met online due to COVID pandemic.   Guardian/Payee:  N/A    Paperwork requested: No   Reason for Visit /Presenting Problem: Difficult family dynamic with oldest daughter  Mental Status Exam: Appearance:   Casual     Behavior:  Appropriate  Motor:  Normal  Speech/Language:   Normal Rate  Affect:  Appropriate  Mood:  normal  Thought process:  normal  Thought content:    WNL  Sensory/Perceptual disturbances:    WNL  Orientation:  oriented to person, place, and situation  Attention:  Good  Concentration:  Good  Memory:  WNL  Fund of knowledge:   Good  Insight:    Good  Judgment:   Good  Impulse Control:  Good     Reported Symptoms:  anxiety/worry, agitation  Risk Assessment: Danger to Self:  No Self-injurious Behavior: No Danger to Others: No Duty to Warn:no Physical Aggression / Violence:No  Access to Firearms a concern: No  Gang Involvement:No  Patient / guardian was educated about steps to take if suicide or homicide risk level increases between visits: n/a While future psychiatric events cannot be accurately predicted, the patient does not currently require acute inpatient psychiatric care and does not currently meet [facility] involuntary commitment criteria.  Substance Abuse History: Current substance abuse: No     Past Psychiatric History:   No previous psychological problems have been observed Outpatient Providers:N/A History of Psych Hospitalization: No  Psychological Testing:  N/A    Abuse History:  Victim of: No.,   N/A    Report needed: No. Victim of Neglect:No. Perpetrator of  N/a   Witness / Exposure to Domestic Violence: No   Protective Services Involvement: No  Witness to [employer] Violence:  No   Family History:  Family History  Problem Relation Age of Onset   Breast cancer Maternal Aunt     Living situation: the patient lives with their spouse  Sexual Orientation: Straight  Relationship Status: married  Name of spouse / other:Denise Chambers If a parent, number of children / ages:1 biological daughter, early 40's  Support Systems: spouse  Surveyor, quantity Stress:  No   Income/Employment/Disability: Employment  Financial planner:  unknown  Educational History: Education:  unknown  Religion/Sprituality/World View: Jewish  Any cultural differences that may affect / interfere with treatment:  not applicable   Recreation/Hobbies: unknown  Stressors: Marital or family conflict    Strengths: Supportive Relationships, Family, and Friends  Barriers:  Unknown   Legal History: Pending legal issue / charges: The patient has no significant history of legal issues. History of legal issue / charges:  N/A  Medical History/Surgical History: not reviewed No past medical history on file.  Past Surgical History:  Procedure Laterality Date   BREAST BIOPSY Right 10/14/2022   [place] RT BREAST  BX W LOC DEV 1ST LESION IMG BX SPEC US GUIDE 10/14/2022 [facility] MAMMOGRAPHY    Medications: Current Outpatient Medications  Medication Sig Dispense Refill   meloxicam (MOBIC) 15 MG tablet meloxicam 15 mg tablet  TAKE 1 TABLET BY MOUTH EVERY DAY AS NEEDED     mesalamine (APRISO) 0.375 g 24 hr capsule Apriso 0.375 gram capsule,extended release     valACYclovir (VALTREX) 1000 MG tablet valacyclovir 1 gram tablet     No current facility-administered medications for this visit.    Allergies  Allergen Reactions   Sulfa Antibiotics Rash and Other (See Comments)    hallucinations   Intitial session: Couple: They  say they are frustrated with her daughter (and Denise Chambers's step-daughter) because she is "pro-[place]. She lives in [place] with three children. Denise Chambers is very vocal with them about her political views. She is not very compassionate with her mother. [name] that her daughter was moving away from them the past year, even before October 7th. They fear that they may be alienated from their grandchildren. Her biological father is an alcoholic and was never involved in her life. There were withdrawal signs before October 7th. They asked about this and she said she needed boundaries. They love their son in law, but lately he is more guarded. They say that Denise Chambers "likes control" and blame others. She has never been easy. Until recently, the conversation would end, kiss and move on. The grands are 10, 8 and 6. She states that she grew up very open with thoughts and feelings. They are not sure if [name]'s positions are related to issues with [name]. She had blame for [name] about her father, but in later years it turned to empathy. They have tried to do things together and she rejects their efforts. They all walk on eggshells. Their middle child was born a girl, who at age six decided he was a boy and now at age 48 is living as a boy. Goals: Patient is seeking a strategy to effectively communicated with her daughter regarding a variety of issues, both internal and external to the family. She wants to avoid further conflict that may impair family relations. Also hoping to gain insight/understanding to some longer term underlying issues that may be driving daughter's current behaviors. Goal date is 12-24.  Patient is seen face to face in provider office.  Session note: (Couple): Denise Chambers talked with Denise Chambers this morning. She asked Denise Chambers "where things stand" and he reminded her about what she said (did not want Madelene to come visit). He told her that he hopes she is patient and open. She then had a change of heart and acknowledged  that she was "mad that day". They talked about another visit in August and Denise Chambers is hoping there is an softening of her position. This was first time Denise Chambers is hearing about the call. Coming in to session, Denise Chambers came in with a "plan" for her next steps. She feels she is ready to write an e-mail to [place]. She says her side is that she had back pain x 4 years and the associated depression. She feels that [place] or Gerlene Burdock never offered any emotional or practical support. She wants to outline all the things that she has done over the past years, and that Denise Chambers has rejected her. She wants to also say that she has "a new lease on life" and wants to move forward. Discussion is whether to have a meeting first to see grandson.  Decided that he will  speak to Denise Chambers to ask her to either visit them at their home or for her to invite Teasia to join with Denise Chambers to visit at their home. Josephina will work on a draft of her letter. We will then re-write and send after they all visit together and hopefully have a face to face meeting with each other to discuss.                   Diagnoses:  Anxiety Disorder with mixed moods/conduct  Plan of Care: Outpatient psychotherapy/family therapy   Garrel Ridgel, PhD 7:40a-8:30a 50 minutes

## 2023-04-22 DIAGNOSIS — Z79899 Other long term (current) drug therapy: Secondary | ICD-10-CM | POA: Diagnosis not present

## 2023-04-22 DIAGNOSIS — Z9889 Other specified postprocedural states: Secondary | ICD-10-CM | POA: Diagnosis not present

## 2023-04-22 DIAGNOSIS — Z4889 Encounter for other specified surgical aftercare: Secondary | ICD-10-CM | POA: Diagnosis not present

## 2023-04-22 DIAGNOSIS — Z4789 Encounter for other orthopedic aftercare: Secondary | ICD-10-CM | POA: Diagnosis not present

## 2023-05-06 ENCOUNTER — Ambulatory Visit: Payer: Medicare Other | Admitting: Psychology

## 2023-05-06 DIAGNOSIS — F4325 Adjustment disorder with mixed disturbance of emotions and conduct: Secondary | ICD-10-CM | POA: Diagnosis not present

## 2023-05-06 NOTE — Progress Notes (Signed)
[facility] Counselor Initial Adult Exam  Name: Kinisha Lakota Olguin Date: 05/06/2023 MRN: [id number] DOB: August 14, 1956 PCP: Creola Corn, MD     Tawney Laverda Page participated from home, via video, and consented to treatment. Therapist participated from home office. We met online due to COVID pandemic.   Guardian/Payee:  N/A    Paperwork requested: No   Reason for Visit /Presenting Problem: Difficult family dynamic with oldest daughter  Mental Status Exam: Appearance:   Casual     Behavior:  Appropriate  Motor:  Normal  Speech/Language:   Normal Rate  Affect:  Appropriate  Mood:  normal  Thought process:  normal  Thought content:    WNL  Sensory/Perceptual disturbances:    WNL  Orientation:  oriented to person, place, and situation  Attention:  Good  Concentration:  Good  Memory:  WNL  Fund of knowledge:   Good  Insight:    Good  Judgment:   Good  Impulse Control:  Good     Reported Symptoms:  anxiety/worry, agitation  Risk Assessment: Danger to Self:  No Self-injurious Behavior: No Danger to Others: No Duty to Warn:no Physical Aggression / Violence:No  Access to Firearms a concern: No  Gang Involvement:No  Patient / guardian was educated about steps to take if suicide or homicide risk level increases between visits: n/a While future psychiatric events cannot be accurately predicted, the patient does not currently require acute inpatient psychiatric care and does not currently meet [facility] involuntary commitment criteria.  Substance Abuse History: Current substance abuse: No     Past Psychiatric History:   No previous psychological problems have been observed Outpatient Providers:N/A History of Psych Hospitalization: No  Psychological Testing:  N/A    Abuse History:  Victim of: No.,   N/A    Report needed: No. Victim of Neglect:No. Perpetrator of  N/a   Witness / Exposure to Domestic Violence: No   Protective Services Involvement: No  Witness to [employer] Violence:  No   Family History:  Family History  Problem Relation Age of Onset   Breast cancer Maternal Aunt     Living situation: the patient lives with their spouse  Sexual Orientation: Straight  Relationship Status: married  Name of spouse / other:[name] If a parent, number of children / ages:1 biological daughter, early 46's  Support Systems: spouse  Surveyor, quantity Stress:  No   Income/Employment/Disability: Employment  Financial planner:  unknown  Educational History: Education:  unknown  Religion/Sprituality/World View: Jewish  Any cultural differences that may affect / interfere with treatment:  not applicable   Recreation/Hobbies: unknown  Stressors: Marital or family conflict    Strengths: Supportive Relationships, Family, and Friends  Barriers:  Unknown   Legal History: Pending legal issue / charges: The patient has no significant history of legal issues. History of legal issue / charges:  N/A  Medical History/Surgical History: not reviewed No past medical history on file.  Past Surgical History:  Procedure Laterality Date   BREAST BIOPSY Right 10/14/2022   [place] RT BREAST  BX W LOC DEV 1ST LESION IMG BX SPEC US GUIDE 10/14/2022 [facility] MAMMOGRAPHY    Medications: Current Outpatient Medications  Medication Sig Dispense Refill   meloxicam (MOBIC) 15 MG tablet meloxicam 15 mg tablet  TAKE 1 TABLET BY MOUTH EVERY DAY AS NEEDED     mesalamine (APRISO) 0.375 g 24 hr capsule Apriso 0.375 gram capsule,extended release     valACYclovir (VALTREX) 1000 MG tablet valacyclovir 1 gram tablet     No current facility-administered medications for this visit.    Allergies  Allergen Reactions   Sulfa Antibiotics Rash and Other (See Comments)    hallucinations   Intitial session: Couple: They  say they are frustrated with her daughter (and [name]'s step-daughter) because she is "pro-Palestinian. She lives in [place] with three children. [name] is very vocal with them about her political views. She is not very compassionate with her mother. Jesseca felt that her daughter was moving away from them the past year, even before October 7th. They fear that they may be alienated from their grandchildren. Her biological father is an alcoholic and was never involved in her life. There were withdrawal signs before October 7th. They asked about this and she said she needed boundaries. They love their son in law, but lately he is more guarded. They say that [name] "likes control" and blame others. She has never been easy. Until recently, the conversation would end, kiss and move on. The grands are 10, 8 and 6. She states that she grew up very open with thoughts and feelings. They are not sure if [name]'s positions are related to issues with Eknoor. She had blame for Yaritzy about her father, but in later years it turned to empathy. They have tried to do things together and she rejects their efforts. They all walk on eggshells. Their middle child was born a girl, who at age [age] decided he was a boy and now at age 64 is living as a boy. Goals: Patient is seeking a strategy to effectively communicated with her daughter regarding a variety of issues, both internal and external to the family. She wants to avoid further conflict that may impair family relations. Also hoping to gain insight/understanding to some longer term underlying issues that may be driving daughter's current behaviors. Goal date is 12-24.  Patient is seen face to face in provider office.  Session note: (Couple): They are scheduled to go see [name] on Aug. 10th. [name] wants a phone call from Marabeth which is a barrier. Lennie is upset that [name] because she has to ask if he talked with [place].             Lashayla refuses to call at [name]'s demand and wants to  send her an e-mail expressing her feelings. [name] is frustrated that she will not be willing to call so they can visit together. Anmarie has written a letter that is raw and is willing to have [name] review before sending to me. I will review and edit and then return for her to send [name]. Plan will be to send and then determine next steps. [name] will go see [name] without Amirra. He is not happy that Meshia will not do what is needed to be able to go with him, but claims he understands and supports her.     Diagnoses:  Anxiety Disorder with mixed moods/conduct  Plan of Care: Outpatient psychotherapy/family therapy   Garrel Ridgel, PhD 10:40a-11:30a 50 minutes

## 2023-05-27 ENCOUNTER — Ambulatory Visit: Payer: Medicare Other | Admitting: Psychology

## 2023-05-27 DIAGNOSIS — F4325 Adjustment disorder with mixed disturbance of emotions and conduct: Secondary | ICD-10-CM

## 2023-05-27 NOTE — Progress Notes (Signed)
[facility] Counselor Initial Adult Exam  Name: Dagmar Tyrell Delpino Date: 05/27/2023 MRN: [id number] DOB: Feb 27, 1956 PCP: Creola Corn, MD     Giannie Laverda Page participated from home, via video, and consented to treatment. Therapist participated from home office. We met online due to COVID pandemic.   Guardian/Payee:  N/A    Paperwork requested: No   Reason for Visit /Presenting Problem: Difficult family dynamic with oldest daughter  Mental Status Exam: Appearance:   Casual     Behavior:  Appropriate  Motor:  Normal  Speech/Language:   Normal Rate  Affect:  Appropriate  Mood:  normal  Thought process:  normal  Thought content:    WNL  Sensory/Perceptual disturbances:    WNL  Orientation:  oriented to person, place, and situation  Attention:  Good  Concentration:  Good  Memory:  WNL  Fund of knowledge:   Good  Insight:    Good  Judgment:   Good  Impulse Control:  Good     Reported Symptoms:  anxiety/worry, agitation  Risk Assessment: Danger to Self:  No Self-injurious Behavior: No Danger to Others: No Duty to Warn:no Physical Aggression / Violence:No  Access to Firearms a concern: No  Gang Involvement:No  Patient / guardian was educated about steps to take if suicide or homicide risk level increases between visits: n/a While future psychiatric events cannot be accurately predicted, the patient does not currently require acute inpatient psychiatric care and does not currently meet [facility] involuntary commitment criteria.  Substance Abuse History: Current substance abuse: No     Past Psychiatric History:   No previous psychological problems have been observed Outpatient Providers:N/A History of Psych Hospitalization: No  Psychological Testing:  N/A    Abuse  History:  Victim of: No.,  N/A    Report needed: No. Victim of Neglect:No. Perpetrator of  N/a   Witness / Exposure to Domestic Violence: No   Protective Services Involvement: No  Witness to [employer] Violence:  No   Family History:  Family History  Problem Relation Age of Onset   Breast cancer Maternal Aunt     Living situation: the patient lives with their spouse  Sexual Orientation: Straight  Relationship Status: married  Name of spouse / other:[name] If a parent, number of children / ages:1 biological daughter, early 20's  Support Systems: spouse  Surveyor, quantity Stress:  No   Income/Employment/Disability: Employment  Financial planner:  unknown  Educational History: Education:  unknown  Religion/Sprituality/World View: Jewish  Any cultural differences that may affect / interfere with treatment:  not applicable   Recreation/Hobbies: unknown  Stressors: Marital or family conflict    Strengths: Supportive Relationships, Family, and Friends  Barriers:  Unknown   Legal History: Pending legal issue / charges: The patient has no significant history of legal issues. History of legal issue / charges:  N/A  Medical History/Surgical History: not reviewed No past medical history on file.  Past Surgical History:  Procedure Laterality Date   BREAST BIOPSY Right 10/14/2022   [place] RT BREAST BX W LOC DEV 1ST LESION IMG BX SPEC US GUIDE 10/14/2022 [facility] MAMMOGRAPHY    Medications: Current Outpatient Medications  Medication Sig Dispense Refill   meloxicam (MOBIC) 15 MG tablet meloxicam 15 mg tablet  TAKE 1 TABLET BY MOUTH EVERY DAY AS NEEDED     mesalamine (APRISO) 0.375 g 24 hr capsule Apriso 0.375 gram capsule,extended release     valACYclovir (VALTREX) 1000 MG tablet valacyclovir 1 gram tablet     No current facility-administered medications for this visit.    Allergies  Allergen Reactions   Sulfa Antibiotics Rash and Other (See Comments)    hallucinations    Intitial session: Couple: They say they are frustrated with her daughter (and [name]'s step-daughter) because she is "pro-Palestinian. She lives in [place] with three children. [name] is very vocal with them about her political views. She is not very compassionate with her mother. Kyriaki felt that her daughter was moving away from them the past year, even before October 7th. They fear that they may be alienated from their grandchildren. Her biological father is an alcoholic and was never involved in her life. There were withdrawal signs before October 7th. They asked about this and she said she needed boundaries. They love their son in law, but lately he is more guarded. They say that [name] "likes control" and blame others. She has never been easy. Until recently, the conversation would end, kiss and move on. The grands are 10, 8 and 6. She states that she grew up very open with thoughts and feelings. They are not sure if [name]'s positions are related to issues with Janelly. She had blame for Reis about her father, but in later years it turned to empathy. They have tried to do things together and she rejects their efforts. They all walk on eggshells. Their middle child was born a girl, who at age six decided he was a boy and now at age 23 is living as a boy. Goals: Patient is seeking a strategy to effectively communicated with her daughter regarding a variety of issues, both internal and external to the family. She wants to avoid further conflict that may impair family relations. Also hoping to gain insight/understanding to some longer term underlying issues that may be driving daughter's current behaviors. Goal date is 12-24.  Patient is seen face to face in provider office.  Session note: (Couple): We reviewed a letter that Etheline and [name] have crafted to send to [facility]. Danuta wants to send asap. We will discuss the response when received. We also discussed that they feel bad that Corrina never initiates  contact. I emphasized the importance of letting her know how they feel.      Diagnoses:  Anxiety Disorder with mixed moods/conduct  Plan of Care: Outpatient psychotherapy/family therapy   Garrel Ridgel, PhD 1:10p-2:00p50 minutes

## 2023-06-10 ENCOUNTER — Ambulatory Visit: Payer: Medicare Other | Admitting: Psychology

## 2023-06-25 DIAGNOSIS — H2513 Age-related nuclear cataract, bilateral: Secondary | ICD-10-CM | POA: Diagnosis not present

## 2023-07-14 DIAGNOSIS — Z1389 Encounter for screening for other disorder: Secondary | ICD-10-CM | POA: Diagnosis not present

## 2023-07-14 DIAGNOSIS — E559 Vitamin D deficiency, unspecified: Secondary | ICD-10-CM | POA: Diagnosis not present

## 2023-07-14 DIAGNOSIS — E049 Nontoxic goiter, unspecified: Secondary | ICD-10-CM | POA: Diagnosis not present

## 2023-07-14 DIAGNOSIS — Z1212 Encounter for screening for malignant neoplasm of rectum: Secondary | ICD-10-CM | POA: Diagnosis not present

## 2023-07-14 DIAGNOSIS — E785 Hyperlipidemia, unspecified: Secondary | ICD-10-CM | POA: Diagnosis not present

## 2023-07-14 DIAGNOSIS — K080 Exfoliation of teeth due to systemic causes: Secondary | ICD-10-CM | POA: Diagnosis not present

## 2023-07-21 DIAGNOSIS — Z1331 Encounter for screening for depression: Secondary | ICD-10-CM | POA: Diagnosis not present

## 2023-07-21 DIAGNOSIS — Z0000 Encounter for general adult medical examination without abnormal findings: Secondary | ICD-10-CM | POA: Diagnosis not present

## 2023-07-21 DIAGNOSIS — K5190 Ulcerative colitis, unspecified, without complications: Secondary | ICD-10-CM | POA: Diagnosis not present

## 2023-07-21 DIAGNOSIS — Z23 Encounter for immunization: Secondary | ICD-10-CM | POA: Diagnosis not present

## 2023-07-21 DIAGNOSIS — Z1389 Encounter for screening for other disorder: Secondary | ICD-10-CM | POA: Diagnosis not present

## 2023-07-22 ENCOUNTER — Ambulatory Visit: Payer: Medicare Other | Admitting: Psychology

## 2023-07-22 DIAGNOSIS — F4325 Adjustment disorder with mixed disturbance of emotions and conduct: Secondary | ICD-10-CM

## 2023-07-22 NOTE — Progress Notes (Signed)
[facility] Counselor Initial Adult Exam  Name: Denise Chambers Date: 07/22/2023 MRN: [id number] DOB: 1956/09/26 PCP: Denise Corn, MD     Denise Chambers Page participated from home, via video, and consented to treatment. Therapist participated from home office. We met online due to COVID pandemic.   Guardian/Payee:  N/A    Paperwork requested: No   Reason for Visit /Presenting Problem: Difficult family dynamic with oldest daughter  Mental Status Exam: Appearance:   Casual     Behavior:  Appropriate  Motor:  Normal  Speech/Language:   Normal Rate  Affect:  Appropriate  Mood:  normal  Thought process:  normal  Thought content:    WNL  Sensory/Perceptual disturbances:    WNL  Orientation:  oriented to person, place, and situation  Attention:  Good  Concentration:  Good  Memory:  WNL  Fund of knowledge:   Good  Insight:    Good  Judgment:   Good  Impulse Control:  Good     Reported Symptoms:  anxiety/worry, agitation  Risk Assessment: Danger to Self:  No Self-injurious Behavior: No Danger to Others: No Duty to Warn:no Physical Aggression / Violence:No  Access to Firearms a concern: No  Gang Involvement:No  Patient / guardian was educated about steps to take if suicide or homicide risk level increases between visits: n/a While future psychiatric events cannot be accurately predicted, the patient does not currently require acute inpatient psychiatric care and does not currently meet [facility] involuntary commitment criteria.  Substance Abuse History: Current substance abuse: No     Past Psychiatric History:   No previous psychological problems have been observed Outpatient Providers:N/A History of Psych Hospitalization: No   Psychological Testing:  N/A    Abuse History:  Victim of: No.,  N/A    Report needed: No. Victim of Neglect:No. Perpetrator of  N/a   Witness / Exposure to Domestic Violence: No   Protective Services Involvement: No  Witness to [employer] Violence:  No   Family History:  Family History  Problem Relation Age of Onset   Breast cancer Maternal Aunt     Living situation: the patient lives with their spouse  Sexual Orientation: Straight  Relationship Status: married  Name of spouse / other:Denise Chambers If a parent, number of children / ages:1 biological daughter, early 52's  Support Systems: spouse  Surveyor, quantity Stress:  No   Income/Employment/Disability: Employment  Financial planner:  unknown  Educational History: Education:  unknown  Religion/Sprituality/World View: Jewish  Any cultural differences that may affect / interfere with treatment:  not applicable   Recreation/Hobbies: unknown  Stressors: Marital or family conflict    Strengths: Supportive Relationships, Family, and Friends  Barriers:  Unknown   Legal History: Pending legal issue / charges: The patient has no significant history of legal issues. History of legal issue / charges:  N/A  Medical History/Surgical History: not reviewed No past medical history on file.  Past Surgical History:  Procedure Laterality Date   BREAST BIOPSY Right 10/14/2022   [place] RT BREAST BX W LOC DEV 1ST LESION IMG BX SPEC US GUIDE 10/14/2022 [facility] MAMMOGRAPHY    Medications: Current Outpatient Medications  Medication Sig Dispense Refill   meloxicam (MOBIC) 15 MG tablet meloxicam 15 mg tablet  TAKE 1 TABLET BY MOUTH EVERY DAY AS NEEDED     mesalamine (APRISO) 0.375 g 24 hr capsule Apriso 0.375 gram capsule,extended release     valACYclovir (VALTREX) 1000 MG tablet valacyclovir 1 gram tablet     No current facility-administered medications for this visit.    Allergies  Allergen Reactions   Sulfa Antibiotics Rash and Other  (See Comments)    hallucinations   Intitial session: Couple: They say they are frustrated with her daughter (and Denise Chambers's step-daughter) because she is "pro-Palestinian. She lives in [place] with three children. Denise Chambers is very vocal with them about her political views. She is not very compassionate with her mother. Denise Chambers felt that her daughter was moving away from them the past year, even before October 7th. They fear that they may be alienated from their grandchildren. Her biological father is an alcoholic and was never involved in her life. Denise Chambers were withdrawal signs before October 7th. They asked about this and she said she needed boundaries. They love their son in law, but lately he is more guarded. They say that Denise Chambers "likes control" and blame others. She has never been easy. Until recently, the conversation would end, kiss and move on. The grands are 10, 8 and 6. She states that she grew up very open with thoughts and feelings. They are not sure if Denise Chambers positions are related to issues with Denise Chambers. She had blame for Denise Chambers about her father, but in later years it turned to empathy. They have tried to do things together and she rejects their efforts. They all walk on eggshells. Their middle child was born a girl, who at age six decided he was a boy and now at age 6 is living as a boy. Goals: Patient is seeking a strategy to effectively communicated with her daughter regarding a variety of issues, both internal and external to the family. She wants to avoid further conflict that may impair family relations. Also hoping to gain insight/understanding to some longer term underlying issues that may be driving daughter's current behaviors. Goal date is 12-24.  Patient is seen face to face in provider office.  Session note: (Couple): She sent letter after the last session and got response "a few weeks later". Denise Chambers says it was "the same old thing" and she is "done". Denise Chambers is disappointed and feels that he is often  in the middle between his wife and daughter. She is clear that she does not want to stand in the way of him having a relationship with his daughter, but does not want to put herself in a position of being villainized again. After a discussion of next steps and options, it was decided to pause treatment sessions until Denise Chambers are further developments or an effor on Denise Chambers's daughter's part to "move beyond the past" and discuss having a relationship with an eye on the future. They will, as in years past, invite her and her family for the holidays.        Diagnoses:  Anxiety Disorder with mixed moods/conduct  Plan of Care: Outpatient psychotherapy/family therapy   Garrel Ridgel, PhD  1:10p-2:00p50 minutes

## 2023-07-29 DIAGNOSIS — M7711 Lateral epicondylitis, right elbow: Secondary | ICD-10-CM | POA: Diagnosis not present

## 2023-07-29 DIAGNOSIS — M7701 Medial epicondylitis, right elbow: Secondary | ICD-10-CM | POA: Diagnosis not present

## 2023-08-05 ENCOUNTER — Ambulatory Visit: Payer: Medicare Other | Admitting: Psychology

## 2023-09-08 DIAGNOSIS — K080 Exfoliation of teeth due to systemic causes: Secondary | ICD-10-CM | POA: Diagnosis not present

## 2023-09-15 DIAGNOSIS — Z01419 Encounter for gynecological examination (general) (routine) without abnormal findings: Secondary | ICD-10-CM | POA: Diagnosis not present

## 2023-09-15 DIAGNOSIS — Z6829 Body mass index (BMI) 29.0-29.9, adult: Secondary | ICD-10-CM | POA: Diagnosis not present

## 2023-09-17 DIAGNOSIS — M7711 Lateral epicondylitis, right elbow: Secondary | ICD-10-CM | POA: Diagnosis not present

## 2023-09-17 DIAGNOSIS — M7701 Medial epicondylitis, right elbow: Secondary | ICD-10-CM | POA: Diagnosis not present

## 2023-09-22 ENCOUNTER — Telehealth: Payer: Self-pay | Admitting: Internal Medicine

## 2023-09-22 NOTE — Telephone Encounter (Signed)
Good afternoon Dr. Rhea Belton  The following patient is being referred to [place] for UC and has requested you as her provider. She has previous GI history with Dr. Kinnie Scales and had a colonoscopy in 2021. Records have been requested since October but the office still has not sent that information and she is currently having a flare. Please review care everywhere and advise of scheduling. Thank you.

## 2023-09-23 ENCOUNTER — Other Ambulatory Visit: Payer: Self-pay

## 2023-09-23 ENCOUNTER — Telehealth: Payer: Self-pay | Admitting: Internal Medicine

## 2023-09-23 DIAGNOSIS — K51219 Ulcerative (chronic) proctitis with unspecified complications: Secondary | ICD-10-CM

## 2023-09-23 NOTE — Telephone Encounter (Signed)
Patient called and stated she was return a call to a nurse. Patient was not sure about the name. Please advise.

## 2023-09-23 NOTE — Telephone Encounter (Signed)
Left message for pt to call back. Lab orders entered in epic as requested. Pt scheduled to see Dr. Doy Hutching 10/20/23 at 11:38M.

## 2023-09-23 NOTE — Telephone Encounter (Signed)
Shonpryl, Given clinical issues, I will have Bonita Quin reach out to patient:  Patient with a history of ulcerative colitis seen by [facility] several years ago  Please let her know that Dr. Doy Hutching will start here in January having transferred from [facility] and given her expertise in IBD it would be appropriate for this patient to establish with her The wait for new patient visit with me is lengthy  She has previously been on mesalamine  I would have her come for CBC, CMP, CRP and a fecal calprotectin Please get her a visit with Dr. Doy Hutching in January We may start mesalamine back prior to her visit but I would like to get labs and fecal calprotectin first  Thank you

## 2023-09-23 NOTE — Telephone Encounter (Signed)
Spoke with pt and she is aware of recommendations, knows to come for labs and is aware of appt.

## 2023-09-23 NOTE — Telephone Encounter (Signed)
See additional phone note. 

## 2023-09-25 ENCOUNTER — Other Ambulatory Visit (INDEPENDENT_AMBULATORY_CARE_PROVIDER_SITE_OTHER): Payer: Medicare Other

## 2023-09-25 DIAGNOSIS — K51219 Ulcerative (chronic) proctitis with unspecified complications: Secondary | ICD-10-CM

## 2023-09-25 LAB — CBC WITH DIFFERENTIAL/PLATELET
Basophils Absolute: 0.1 10*3/uL (ref 0.0–0.1)
Basophils Relative: 1.1 % (ref 0.0–3.0)
Eosinophils Absolute: 0.4 10*3/uL (ref 0.0–0.7)
Eosinophils Relative: 6.3 % — ABNORMAL HIGH (ref 0.0–5.0)
HCT: 41.3 % (ref 36.0–46.0)
Hemoglobin: 13.5 g/dL (ref 12.0–15.0)
Lymphocytes Relative: 40.7 % (ref 12.0–46.0)
Lymphs Abs: 2.5 10*3/uL (ref 0.7–4.0)
MCHC: 32.8 g/dL (ref 30.0–36.0)
MCV: 83.6 fL (ref 78.0–100.0)
Monocytes Absolute: 0.5 10*3/uL (ref 0.1–1.0)
Monocytes Relative: 8.8 % (ref 3.0–12.0)
Neutro Abs: 2.7 10*3/uL (ref 1.4–7.7)
Neutrophils Relative %: 43.1 % (ref 43.0–77.0)
Platelets: 318 10*3/uL (ref 150.0–400.0)
RBC: 4.94 Mil/uL (ref 3.87–5.11)
RDW: 14.1 % (ref 11.5–15.5)
WBC: 6.3 10*3/uL (ref 4.0–10.5)

## 2023-09-25 LAB — COMPREHENSIVE METABOLIC PANEL
ALT: 19 U/L (ref 0–35)
AST: 15 U/L (ref 0–37)
Albumin: 3.9 g/dL (ref 3.5–5.2)
Alkaline Phosphatase: 104 U/L (ref 39–117)
BUN: 19 mg/dL (ref 6–23)
CO2: 28 meq/L (ref 19–32)
Calcium: 9.5 mg/dL (ref 8.4–10.5)
Chloride: 102 meq/L (ref 96–112)
Creatinine, Ser: 0.63 mg/dL (ref 0.40–1.20)
GFR: 91.14 mL/min (ref >60.00)
Glucose, Bld: 78 mg/dL (ref 70–99)
Potassium: 3.8 meq/L (ref 3.5–5.1)
Sodium: 138 meq/L (ref 135–145)
Total Bilirubin: 0.6 mg/dL (ref 0.2–1.2)
Total Protein: 7.2 g/dL (ref 6.0–8.3)

## 2023-09-25 LAB — C-REACTIVE PROTEIN: CRP: <1 mg/dL (ref 0.5–20.0)

## 2023-09-28 ENCOUNTER — Other Ambulatory Visit: Payer: Medicare Other

## 2023-09-28 DIAGNOSIS — K51219 Ulcerative (chronic) proctitis with unspecified complications: Secondary | ICD-10-CM | POA: Diagnosis not present

## 2023-09-30 LAB — CALPROTECTIN, FECAL: Calprotectin, Fecal: 138 ug/g — ABNORMAL HIGH (ref 0–120)

## 2023-10-05 ENCOUNTER — Other Ambulatory Visit: Payer: Self-pay

## 2023-10-05 DIAGNOSIS — K51219 Ulcerative (chronic) proctitis with unspecified complications: Secondary | ICD-10-CM

## 2023-10-05 DIAGNOSIS — M5459 Other low back pain: Secondary | ICD-10-CM | POA: Diagnosis not present

## 2023-10-05 MED ORDER — MESALAMINE 1000 MG RE SUPP
1000.0000 mg | Freq: Every day | RECTAL | 0 refills | Status: DC
Start: 2023-10-05 — End: 2023-10-20

## 2023-10-13 DIAGNOSIS — L821 Other seborrheic keratosis: Secondary | ICD-10-CM | POA: Diagnosis not present

## 2023-10-13 DIAGNOSIS — L570 Actinic keratosis: Secondary | ICD-10-CM | POA: Diagnosis not present

## 2023-10-13 DIAGNOSIS — D225 Melanocytic nevi of trunk: Secondary | ICD-10-CM | POA: Diagnosis not present

## 2023-10-13 DIAGNOSIS — L578 Other skin changes due to chronic exposure to nonionizing radiation: Secondary | ICD-10-CM | POA: Diagnosis not present

## 2023-10-18 NOTE — Progress Notes (Addendum)
 [facility] Initial Consultation   Referring Provider Onita Rush, MD [address] [place],  [place] [address]  Primary Care Provider Onita Rush, MD  Patient Profile: Denise Chambers is a 68 y.o. 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 at the request of Dr. Onita for evaluation and management of the problem(s) noted below.  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 is a 68 y.o. 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   In speaking with [name] as well as reviewing her prior she reports the onset of rectal bleeding, diarrhea, bloating more than 20 years ago in the early 2000's She recalls undergoing evaluation and being told that she had a limited form of ulcerative proctitis Was initially treated with oral medications which were unfortunately ineffective (she cannot recall names) Recounts that she was treated with Remicade for 1 year shortly after the time of diagnosis which worked for her but she developed a psoriasiform rash She is not entirely clear why Remicade was discontinued or if it was related to the psoriasis She was subsequently transitioned to what sounds to be oral mesalamine  in conjunction with rectal mesalamine   Available records from 2019-2021 describes a history of ulcerative 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 She was documented to have mild flare symptoms in 2019 which she was treated with Canasa  suppositories Colonoscopy 04/25/2020 showed 2 sigmoid colon polyps (adenoma x1), internal hemorrhoids and description of a 5-year recall Due to cost she was being maintained  on a formulation of mesalamine  obtained from [place] Raffasal 250 mg daily  At today's visit she informs me that she developed a flare of her proctitis in mid November 2024 At the height of her symptoms with having up to 4 bowel movements a day often clustered in the morning Endorses rectal bleeding, fecal urgency, tenesmus She has been prescribed Canasa  suppositories 1 g per rectum nightly which she has been taking for the last 2 weeks and has noted some improvement in symptoms Urgency and bleeding seem to be decreasing No abdominal pain or cramping She has had some perianal soreness and is applying A&E ointment  Denies extraintestinal manifestations of IBD including fevers, chills, skin rashes or oral ulcers Has noted some aching joints in her fingers, elbows and knees but is unsure if this could be osteoarthritis  Ms.Hibbitts describes food sensitivities to milk, gluten and fried foods which she attempts to limit in her diet  Family history of Crohn's disease in her cousin's son No colorectal cancer Final aunt had stomach cancer and breast cancer  Last colonoscopy: 04/2020 - 2 sigmoid colon polyps (adenoma x1), internal hemorrhoids and description of a 5-year recall Last endoscopy: None  Last Abd CT/CTE/MRE: None  GI Review of Symptoms Significant for fecal urgency, rectal bleeding. Otherwise negative.  General Review of Systems  Review of systems is significant for the pertinent positives and negatives as listed per the HPI.  Full ROS is otherwise negative.  Inflammatory Bowel Disease History  In speaking with [name] as well as reviewing her prior she reports the onset of rectal bleeding, diarrhea, bloating more than 20 years ago in the early 2000's She recalls undergoing evaluation and being told that she had a  limited form of ulcerative proctitis Was initially treated with oral medications which were unfortunately ineffective (she cannot recall names) Recounts that she was treated  with Remicade for 1 year shortly after the time of diagnosis which worked for her but she developed a psoriasiform rash She is not entirely clear why Remicade was discontinued or if it was related to the psoriasis She was subsequently transitioned to what sounds to be oral mesalamine  in conjunction with rectal mesalamine   Available records from 2019-2021 describes a history of ulcerative 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 She was documented to have mild flare symptoms in 2019 which she was treated with Canasa  suppositories Colonoscopy 04/25/2020 showed 2 sigmoid colon polyps (adenoma x1), internal hemorrhoids and description of a 5-year recall Due to cost she was being maintained on a formulation of mesalamine  obtained from [place] Raffasal 250 mg daily  IBD Medication History Oral and rectal mesalamine  Remicade-treated for 1 year shortly after diagnosis and states she developed psoriasis  Past Medical History   Past Medical History:  Diagnosis Date   Cutaneous lupus erythematosus    Psoriasis    Developed on infliximab   Ulcerative colitis (HCC)       Past Surgical History   Past Surgical History:  Procedure Laterality Date   BREAST BIOPSY Right 10/14/2022   US  RT BREAST BX W LOC DEV 1ST LESION IMG BX SPEC US  GUIDE 10/14/2022 [facility]   COLONOSCOPY     LUMBAR DISC SURGERY     TUBAL LIGATION        Allergies and Medications   Allergies  Allergen Reactions   Sulfa Antibiotics Rash and Other (See Comments)    hallucinations    Current Meds  Medication Sig   acetaminophen (TYLENOL) 650 MG CR tablet Take 650 mg by mouth as needed.   Cholecalciferol 50 MCG (2000 UT) TABS Take 2 tablets by mouth daily.   DIGESTIVE ENZYMES PO Take 2 capsules by mouth daily.   DULoxetine (CYMBALTA) 30 MG capsule Take 30 mg by mouth daily as needed.   magnesium gluconate (MAGONATE) 500 MG tablet Take 500 mg by mouth daily.   valACYclovir (VALTREX) 1000 MG tablet valacyclovir 1 gram  tablet   [DISCONTINUED] mesalamine  (CANASA ) 1000 MG suppository Place 1 suppository (1,000 mg total) rectally at bedtime for 15 days.    Family History   Family History  Problem Relation Age of Onset   Cancer Mother        type unknown, tumor between pancreas and liver   Hyperlipidemia Mother    Hypertension Mother    Heart disease Father    Stroke Father    Heart attack Father    Hyperlipidemia Sister    Hyperlipidemia Brother    Breast cancer Paternal Aunt      Social History   Social History   Social History Narrative   Not on file   Cecilee reports that she quit smoking about 25 years ago. Her smoking use included cigarettes. She has never used smokeless tobacco. She reports current alcohol  use. She reports that she does not use drugs.  Vital Signs and Physical Examination   Vitals:   10/20/23 1100  BP: 134/80  Pulse: 76  Height: 5' 2.5 (1.588 m) Comment: height measured without shoes  Weight: 169 lb 2 oz (76.7 kg)  BMI (Calculated): 30.42   General: Well developed, well nourished, no acute distress Head: Normocephalic and atraumatic Eyes: Sclerae anicteric, EOMI Ears: Normal auditory acuity Mouth: No deformities or lesions noted Lungs: Clear  throughout to auscultation Heart: Regular rate and rhythm; No murmurs, rubs or bruits Abdomen: Soft, non tender and non distended. No masses, hepatosplenomegaly or hernias noted. Normal Bowel sounds Rectal:Deferred Musculoskeletal: Symmetrical with no gross deformities  Pulses:  Normal pulses noted Extremities: No edema or deformities noted Neurological: Alert oriented x 4, grossly nonfocal Psychological:  Alert and cooperative. Normal mood and affect   Review of Data  The following data was reviewed at the time of this encounter:  Laboratory Studies   Lab Results  Component Value Date   WBC 6.3 09/25/2023   HGB 13.5 09/25/2023   HCT 41.3 09/25/2023   MCV 83.6 09/25/2023   PLT 318.0 09/25/2023      Chemistry      Component Value Date/Time   NA 138 09/25/2023 0811   K 3.8 09/25/2023 0811   CL 102 09/25/2023 0811   CO2 28 09/25/2023 0811   BUN 19 09/25/2023 0811   CREATININE 0.63 09/25/2023 0811      Component Value Date/Time   CALCIUM 9.5 09/25/2023 0811   ALKPHOS 104 09/25/2023 0811   AST 15 09/25/2023 0811   ALT 19 09/25/2023 0811   BILITOT 0.6 09/25/2023 0811      Imaging Studies  None  GI Procedures and Studies  Colonoscopy 04/2020  2 sigmoid colon polyps (adenoma x1), internal hemorrhoids and description of a 5-year recall  Clinical Impression  It is my clinical impression that Ms. Cramer is a 68 y.o. 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 Willcox reports being diagnosed with ulcerative proctitis in the early 2000's after presenting with symptoms of rectal bleeding, diarrhea and abdominal bloating.  She was initially treated with a form of oral medication that she cannot recall.  She feels confident that she was treated with Remicade for 1 year at the beginning of her disease because it was difficult to control.  She recalls responding to Remicade but thinks she developed a psoriasiform rash which may have been the reason the medication is discontinued.  She was subsequently maintained on forms of oral and rectal mesalamine .  Her most recent records document the use of Canasa  suppositories in conjunction with a form of oral mesalamine  from [place] called Raffsal she was utilizing due to cost purposes.  Last colonoscopy performed in 2021 showed a tubular adenoma but no evidence of active inflammation.  With regard to her recent flare, her symptoms are beginning to improve on Canasa  suppositories 1 g per rectum nightly.  I have advised continuing treatment with Canasa  and monitoring her symptoms over the next 2 weeks.  If her symptoms quiesce we can strategize a plan for tapering  suppositories over time.  If, however, her symptoms progress or worsen can consider a trial of hydrocortisone  suppositories and restaging colonoscopy.  Plan  Continue Canasa  suppositories 1 g per rectum nightly.  Prescription sent to local pharmacy as well as a second [facility]. As above if symptoms progress or worsen can consider hydrocortisone  suppositories as well as restaging colonoscopy. Continue limiting foods contributing to GI sensitivity including milk, gluten and fried foods  IBD Health Maintenance  Vaccinations Influenza: PCV13: PPSV23: COVID19: HAV/HBV: Shingles: HPV:  DEXA Date:                            Result:  Pap Smear   Eye Exam   Skin Exam  Surveillance Colonoscopy Due 2026  Tobacco Use   Depression Screen     Planned Follow Up Return in about 3 months (around 01/18/2024).  The patient or caregiver verbalized understanding of the material covered, with no barriers to understanding. All questions were answered. Patient or caregiver is agreeable with the plan outlined above.    It was a pleasure to see Rashi.  If you have any questions or concerns regarding this evaluation, do not hesitate to contact me.  Inocente Hausen, MD [facility] .  This encounter is billed level IV by medical decision making (MDM) based upon exacerbation of underlying chronic illness and need for prescription medication management

## 2023-10-20 ENCOUNTER — Encounter: Payer: Self-pay | Admitting: Pediatrics

## 2023-10-20 ENCOUNTER — Ambulatory Visit: Payer: Medicare Other | Admitting: Pediatrics

## 2023-10-20 VITALS — BP 134/80 | HR 76 | Ht 62.5 in | Wt 169.1 lb

## 2023-10-20 DIAGNOSIS — K5120 Ulcerative (chronic) proctitis without complications: Secondary | ICD-10-CM

## 2023-10-20 DIAGNOSIS — K51219 Ulcerative (chronic) proctitis with unspecified complications: Secondary | ICD-10-CM

## 2023-10-20 DIAGNOSIS — Z860100 Personal history of colon polyps, unspecified: Secondary | ICD-10-CM

## 2023-10-20 DIAGNOSIS — Z860101 Personal history of adenomatous and serrated colon polyps: Secondary | ICD-10-CM

## 2023-10-20 MED ORDER — MESALAMINE 1000 MG RE SUPP
1000.0000 mg | Freq: Every day | RECTAL | 0 refills | Status: DC
Start: 2023-10-20 — End: 2023-10-21

## 2023-10-20 MED ORDER — MESALAMINE 1000 MG RE SUPP
1000.0000 mg | Freq: Every day | RECTAL | 1 refills | Status: DC
Start: 2023-10-20 — End: 2023-10-20

## 2023-10-20 NOTE — Patient Instructions (Addendum)
 We have sent the following medications to your pharmacy for you to pick up at your convenience: Canasa  suppositories- 1 suppository per rectum nightly.   Look into [facility] for cheaper canasa  medication: [email or web address] _______________________________________________________  Please follow up with Dr Suzann in 2-3 months. _______________________________________________________  If your blood pressure at your visit was 140/90 or greater, please contact your primary care physician to follow up on this.  _______________________________________________________  If you are age 71 or older, your body mass index should be between 23-30. Your Body mass index is 30.44 kg/m. If this is out of the aforementioned range listed, please consider follow up with your Primary Care Provider.  If you are age 51 or younger, your body mass index should be between 19-25. Your Body mass index is 30.44 kg/m. If this is out of the aformentioned range listed, please consider follow up with your Primary Care Provider.   ________________________________________________________  The [facility] providers would like to encourage you to use MYCHART to communicate with providers for non-urgent requests or questions.  Due to long hold times on the telephone, sending your provider a message by [facility] may be a faster and more efficient way to get a response.  Please allow 48 business hours for a response.  Please remember that this is for non-urgent requests.  _______________________________________________________  Due to recent changes in healthcare laws, you may see the results of your imaging and laboratory studies on MyChart before your provider has had a chance to review them.  We understand that in some cases there may be results that are confusing or concerning to you. Not all laboratory results come back in the same time frame and the provider may be waiting for multiple results in order to  interpret others.  Please give us  48 hours in order for your provider to thoroughly review all the results before contacting the office for clarification of your results.

## 2023-10-21 ENCOUNTER — Telehealth: Payer: Self-pay | Admitting: Pediatrics

## 2023-10-21 DIAGNOSIS — K51219 Ulcerative (chronic) proctitis with unspecified complications: Secondary | ICD-10-CM

## 2023-10-21 MED ORDER — MESALAMINE 1000 MG RE SUPP
RECTAL | 1 refills | Status: DC
Start: 2023-10-21 — End: 2023-10-21

## 2023-10-21 MED ORDER — MESALAMINE 1000 MG RE SUPP
RECTAL | 1 refills | Status: DC
Start: 2023-10-21 — End: 2023-11-03

## 2023-10-21 NOTE — Telephone Encounter (Signed)
 I have spoken to patient to advise that canasa  Rx has been sent to [facility] along with her email address and NCPDP as requested. She is asked to call us  with any additional questions or concerns.

## 2023-10-21 NOTE — Telephone Encounter (Signed)
 Patient called and stated that she was able to look into [facility] and they are requesting us  to send over a prescription along with her email address [phone] [email or web address] and the [id number]. Patient is requesting a call back. Please advise.

## 2023-10-22 ENCOUNTER — Encounter: Payer: Self-pay | Admitting: Pediatrics

## 2023-10-30 DIAGNOSIS — M5459 Other low back pain: Secondary | ICD-10-CM | POA: Diagnosis not present

## 2023-11-02 ENCOUNTER — Telehealth: Payer: Self-pay | Admitting: Pediatrics

## 2023-11-02 ENCOUNTER — Encounter: Payer: Self-pay | Admitting: Pediatrics

## 2023-11-02 DIAGNOSIS — K51219 Ulcerative (chronic) proctitis with unspecified complications: Secondary | ICD-10-CM

## 2023-11-02 NOTE — Telephone Encounter (Signed)
Spoke to [place] at [facility] (phone [phone]) to try to discern why patient is having difficulty with getting canasa. [facility] states that the date of birth we have provided for the account is different the date of birth they have for the patient. Therefore, our electronic prescription did not "flow over" to the patient account.   I have spoken to patient to ask that she call [facility] and confirm her correct date of birth. Following this, we will resend the prescription to the pharmacy. She verbalizes understanding.

## 2023-11-03 MED ORDER — SUFLAVE 178.7 G PO SOLR: 1.0000 | Freq: Once | ORAL | 0 refills | Status: AC

## 2023-11-03 MED ORDER — HYDROCORTISONE ACETATE 25 MG RE SUPP: 25.0000 mg | Freq: Two times a day (BID) | RECTAL | 2 refills | Status: DC

## 2023-11-03 MED ORDER — MESALAMINE 1000 MG RE SUPP
RECTAL | 1 refills | Status: AC
Start: 2023-11-03 — End: ?

## 2023-11-03 NOTE — Telephone Encounter (Signed)
Reached out to patient this morning. She was able to finally get in touch with [facility] representative to have her birth date updated in their system. Rx has again be resent to pharmacy since her account is now updated.   Patient tells me that she has been using canasa suppositories nightly x 2 weeks but continues to have abdominal discomfort, +gas, rectal bleeding and alternating constipation/diarrhea. She notes that she has had a significant amount of fecal urgency/rectal tenesmus as well.  Please advise.Marland KitchenMarland Kitchen

## 2023-11-03 NOTE — Addendum Note (Signed)
Addended by: Richardson Chiquito on: 11/03/2023 01:10 PM   Modules accepted: Orders

## 2023-11-03 NOTE — Telephone Encounter (Signed)
Patient and I have discussed recommendations from Dr Doy Hutching for addition of hydrocortisone suppositories nightly and canasa to be taken every morning. In addition, advised of the need for restaging colonoscopy with the potential to cancel should her symptoms significantly improve on hydrocortisone suppositories in addition to canasa. Patient verbalizes understanding and voices agreement with this plan. Unfortunately, she is currently at another appointment and says she cannot schedule colonoscopy at this time. However, she says she will call back to get this scheduled following that appointment.

## 2023-11-03 NOTE — Addendum Note (Signed)
Addended by: Richardson Chiquito on: 11/03/2023 10:14 AM   Modules accepted: Orders

## 2023-11-03 NOTE — Telephone Encounter (Signed)
Inbound call from patient requesting to speak with Karen Kitchens, patient states the nurse will know why she is calling. Please advise.

## 2023-11-03 NOTE — Addendum Note (Signed)
Addended by: Richardson Chiquito on: 11/03/2023 04:08 PM   Modules accepted: Orders

## 2023-11-03 NOTE — Telephone Encounter (Signed)
Patient has scheduled colonoscopy for 12/10/23 (was offered 11/18/23 appointment but this date was not desirable due to 4 pm appt time). Patient was advised of time/date/location for upcoming procedure and has been given generalized verbal prep instructions. Discussed that a care partner 18 years or older should bring her, stay for the procedure and drive home due to sedation. Written instructions have been made available to the patient for additional review.

## 2023-11-05 DIAGNOSIS — M5416 Radiculopathy, lumbar region: Secondary | ICD-10-CM | POA: Diagnosis not present

## 2023-11-05 DIAGNOSIS — M48061 Spinal stenosis, lumbar region without neurogenic claudication: Secondary | ICD-10-CM | POA: Diagnosis not present

## 2023-11-05 DIAGNOSIS — M5441 Lumbago with sciatica, right side: Secondary | ICD-10-CM | POA: Diagnosis not present

## 2023-11-05 DIAGNOSIS — Z9889 Other specified postprocedural states: Secondary | ICD-10-CM | POA: Diagnosis not present

## 2023-11-06 DIAGNOSIS — M5459 Other low back pain: Secondary | ICD-10-CM | POA: Diagnosis not present

## 2023-11-09 DIAGNOSIS — M5116 Intervertebral disc disorders with radiculopathy, lumbar region: Secondary | ICD-10-CM | POA: Diagnosis not present

## 2023-11-09 DIAGNOSIS — M48061 Spinal stenosis, lumbar region without neurogenic claudication: Secondary | ICD-10-CM | POA: Diagnosis not present

## 2023-11-09 DIAGNOSIS — M4726 Other spondylosis with radiculopathy, lumbar region: Secondary | ICD-10-CM | POA: Diagnosis not present

## 2023-11-10 DIAGNOSIS — M5459 Other low back pain: Secondary | ICD-10-CM | POA: Diagnosis not present

## 2023-11-12 DIAGNOSIS — H5203 Hypermetropia, bilateral: Secondary | ICD-10-CM | POA: Diagnosis not present

## 2023-11-17 DIAGNOSIS — M5459 Other low back pain: Secondary | ICD-10-CM | POA: Diagnosis not present

## 2023-11-18 DIAGNOSIS — Z4889 Encounter for other specified surgical aftercare: Secondary | ICD-10-CM | POA: Diagnosis not present

## 2023-11-18 DIAGNOSIS — M48061 Spinal stenosis, lumbar region without neurogenic claudication: Secondary | ICD-10-CM | POA: Diagnosis not present

## 2023-11-18 DIAGNOSIS — Z9889 Other specified postprocedural states: Secondary | ICD-10-CM | POA: Diagnosis not present

## 2023-11-18 DIAGNOSIS — M5416 Radiculopathy, lumbar region: Secondary | ICD-10-CM | POA: Diagnosis not present

## 2023-11-19 DIAGNOSIS — M5416 Radiculopathy, lumbar region: Secondary | ICD-10-CM | POA: Diagnosis not present

## 2023-11-26 NOTE — Telephone Encounter (Signed)
Patient advised to continue current medication regimen and we will fully evaluate at upcoming colonoscopy 12/10/23.

## 2023-11-26 NOTE — Telephone Encounter (Signed)
Inbound call from patient, would like to speak with a nurse in regards to upcoming procedure. States she has questions, did not wish to disclose anymore information.

## 2023-11-26 NOTE — Telephone Encounter (Signed)
Patient states that her pharmacy never contacted her about suflave rx that we sent on 11/03/23 and is inquiring about this.  I have contacted [facility] who states they did not get the script sent on 11/03/23. Verbal order given for Suflave along with manufacturer coupon to bring cost to $50.   Patient also would like to know if she needs to continue taking hydrocortisone suppositories every night in addition to mesalamine suppositories in the morning as recommended 11/03/23. States that she does feel somewhat improved with not as much fecal urgency or bleeding, but does still experience intermittent symptoms with abdominal discomfort, constipation/diarrhea.   Dr Doy Hutching, please advise... should she go ahead and refill hydorortisone suppositories in addition to mesalamine? She is scheduled for colonoscopy on 12/10/23.

## 2023-11-27 DIAGNOSIS — M5459 Other low back pain: Secondary | ICD-10-CM | POA: Diagnosis not present

## 2023-11-27 NOTE — Telephone Encounter (Signed)
I have spoken to patient. Suflave is going through for $60 at pharmacy. I advised that it was going to be 134 dollars, so 60 dollars is correct. She verbalizes understanding and states she will pick up prep.

## 2023-11-27 NOTE — Telephone Encounter (Signed)
Patient called requesting to speak with a nurse regarding the cost for the prep medication.

## 2023-11-30 DIAGNOSIS — K080 Exfoliation of teeth due to systemic causes: Secondary | ICD-10-CM | POA: Diagnosis not present

## 2023-12-03 DIAGNOSIS — M5416 Radiculopathy, lumbar region: Secondary | ICD-10-CM | POA: Diagnosis not present

## 2023-12-03 DIAGNOSIS — Z9889 Other specified postprocedural states: Secondary | ICD-10-CM | POA: Diagnosis not present

## 2023-12-04 DIAGNOSIS — M5459 Other low back pain: Secondary | ICD-10-CM | POA: Diagnosis not present

## 2023-12-08 NOTE — Progress Notes (Unsigned)
 [facility] History and Physical   Primary Care Physician:  Creola Corn, MD   Reason for Procedure:  Follow-up of ulcerative proctitis  Plan:    Colonoscopy   HPI: Denise Chambers is a 68 y.o. 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  Denise Chambers experienced a recent flare of symptoms which has been managed with hydrocortisone and Canasa suppositories.  Last colonoscopy was performed in 2021 demonstrating a tubular adenoma but no evidence of active inflammation.  No family history of colorectal cancer   Past Medical History:  Diagnosis Date   Anxiety    Cutaneous lupus erythematosus    Depression    Psoriasis    Developed on infliximab   Ulcerative colitis [facility])     Past Surgical History:  Procedure Laterality Date   BREAST BIOPSY Right 10/14/2022   [place] RT BREAST BX W LOC DEV 1ST LESION IMG BX SPEC US GUIDE 10/14/2022 [facility]   COLONOSCOPY     LUMBAR DISC SURGERY     TUBAL LIGATION      Prior to Admission medications   Medication Sig Start Date End Date Taking? Authorizing Provider  acetaminophen (TYLENOL) 650 MG CR tablet Take 650 mg by mouth as needed. 11/04/22   [provider]  Cholecalciferol 50 MCG (2000 UT) TABS Take 2 tablets by mouth daily. 11/04/22   [provider]  DIGESTIVE ENZYMES PO Take 2 capsules by mouth daily.    [provider]  DULoxetine (CYMBALTA) 30 MG capsule Take 30 mg by mouth daily as needed. 10/09/23   [provider]  hydrocortisone (ANUSOL-HC) 25 MG suppository Place 1 suppository (25 mg total) rectally every 12 (twelve) hours. 11/03/23   Ottie Glazier, MD  magnesium gluconate (MAGONATE) 500 MG tablet Take 500 mg by mouth daily. 11/04/22   [provider]  mesalamine (CANASA) 1000 MG suppository Insert 1 suppository per rectum every night. [email or web address]  [id number] 11/03/23   Ottie Glazier, MD  valACYclovir  (VALTREX) 1000 MG tablet valacyclovir 1 gram tablet    [provider]    Current Outpatient Medications  Medication Sig Dispense Refill   acetaminophen (TYLENOL) 650 MG CR tablet Take 650 mg by mouth as needed.     Cholecalciferol 50 MCG (2000 UT) TABS Take 2 tablets by mouth daily.     DIGESTIVE ENZYMES PO Take 2 capsules by mouth daily.     DULoxetine (CYMBALTA) 30 MG capsule Take 30 mg by mouth daily as needed.     gabapentin (NEURONTIN) 300 MG capsule Take 300 mg by mouth 3 (three) times daily.     hydrocortisone (ANUSOL-HC) 25 MG suppository Place 1 suppository (25 mg total) rectally every 12 (twelve) hours. 30 suppository 2   magnesium gluconate (MAGONATE) 500 MG tablet Take 500 mg by mouth daily.     mesalamine (CANASA) 1000 MG suppository Insert 1 suppository per rectum every night. [email or web address]  [id number] 90 suppository 1   valACYclovir (VALTREX) 1000 MG tablet valacyclovir 1 gram tablet     Current Facility-Administered Medications  Medication Dose Route Frequency Provider Last Rate Last Admin   0.9 %  sodium chloride infusion  500 mL Intravenous Once Ottie Glazier, MD        Allergies as of 12/10/2023 - Review Complete 12/10/2023  Allergen Reaction Noted   Sulfa antibiotics Rash and Other (See Comments) 05/03/2019    Family History  Problem Relation Age of Onset   Cancer  Mother        type unknown, tumor between pancreas and liver   Hyperlipidemia Mother    Hypertension Mother    Heart disease Father    Stroke Father    Heart attack Father    Hyperlipidemia Sister    Hyperlipidemia Brother    Stomach cancer Paternal Aunt    Breast cancer Paternal Aunt    Colon polyps Neg Hx    Rectal cancer Neg Hx     Social History   Socioeconomic History   Marital status: Married    Spouse name: Not on file   Number of children: 2   Years of education: Not on file   Highest education level: Not on file  Occupational History   Occupation:  estitician  Tobacco Use   Smoking status: Former    Current packs/day: 0.00    Types: Cigarettes    Quit date: 2000    Years since quitting: 25.1   Smokeless tobacco: Never  Vaping Use   Vaping status: Never Used  Substance and Sexual Activity   Alcohol use: Yes    Comment: once a month   Drug use: Never   Sexual activity: Not on file  Other Topics Concern   Not on file  Social History Narrative   Not on file   Social Drivers of Health   Financial Resource Strain: Not on file  Food Insecurity: Low Risk  (11/24/2022)   [facility]   Hunger Vital Sign    Worried About Running Out of Food in the Last Year: Never true    Within the past 12 months, the food you bought just didn't last and you didn't have money to get more: Not on file  Transportation Needs: No Transportation Needs (11/24/2022)   [facility] visits prior to 12/06/2022., [facility] visits prior to 12/06/2022.   Transportation    In the past 12 months, has lack of reliable transportation kept you from medical appointments, meetings, work or from getting things needed for daily living?: No  Physical Activity: Not on file  Stress: Not on file  Social Connections: Not on file  Intimate Partner Violence: Low Risk  (11/24/2022)   [facility] visits prior to 12/06/2022., [facility] visits prior to 12/06/2022.   Safety    How often does anyone, including family and friends, physically hurt you?: Never    How often does anyone, including family and friends, insult or talk down to you?: Never    How often does anyone, including family and friends, threaten you with harm?: Never    How often does anyone, including family and friends, scream or curse at you?: Never    Review of Systems:  All other review of systems negative except as mentioned in the HPI.  Physical Exam: Vital  signs BP (!) 120/57   Pulse 81   Temp 98 F (36.7 C) (Temporal)   Ht 5\' 8"  (1.727 m)   Wt 195 lb (88.5 kg)   SpO2 96%   BMI 29.65 kg/m   General:   Alert,  Well-developed, well-nourished, pleasant and cooperative in NAD Airway:  Mallampati 2 Lungs:  Clear throughout to auscultation.   Heart:  Regular rate and rhythm; no murmurs, clicks, rubs,  or gallops. Abdomen:  Soft, nontender and nondistended. Normal bowel sounds.   Neuro/Psych:  Normal mood and affect. A and O x  3  Maren Beach, MD [facility]

## 2023-12-10 ENCOUNTER — Ambulatory Visit: Payer: Medicare Other | Admitting: Pediatrics

## 2023-12-10 ENCOUNTER — Encounter: Payer: Self-pay | Admitting: Pediatrics

## 2023-12-10 VITALS — BP 86/41 | HR 62 | Temp 98.0°F | Resp 12 | Ht 68.0 in | Wt 195.0 lb

## 2023-12-10 DIAGNOSIS — K5120 Ulcerative (chronic) proctitis without complications: Secondary | ICD-10-CM | POA: Diagnosis not present

## 2023-12-10 DIAGNOSIS — K648 Other hemorrhoids: Secondary | ICD-10-CM

## 2023-12-10 DIAGNOSIS — D124 Benign neoplasm of descending colon: Secondary | ICD-10-CM | POA: Diagnosis not present

## 2023-12-10 DIAGNOSIS — K635 Polyp of colon: Secondary | ICD-10-CM | POA: Diagnosis not present

## 2023-12-10 DIAGNOSIS — D125 Benign neoplasm of sigmoid colon: Secondary | ICD-10-CM

## 2023-12-10 DIAGNOSIS — Z860100 Personal history of colon polyps, unspecified: Secondary | ICD-10-CM

## 2023-12-10 DIAGNOSIS — K51219 Ulcerative (chronic) proctitis with unspecified complications: Secondary | ICD-10-CM

## 2023-12-10 DIAGNOSIS — K644 Residual hemorrhoidal skin tags: Secondary | ICD-10-CM | POA: Diagnosis not present

## 2023-12-10 DIAGNOSIS — K6289 Other specified diseases of anus and rectum: Secondary | ICD-10-CM | POA: Diagnosis not present

## 2023-12-10 MED ORDER — SODIUM CHLORIDE 0.9 % IV SOLN
500.0000 mL | Freq: Once | INTRAVENOUS | Status: DC
Start: 2023-12-10 — End: 2023-12-10

## 2023-12-10 NOTE — Progress Notes (Signed)
 Called to room to assist during endoscopic procedure.  Patient ID and intended procedure confirmed with present staff. Received instructions for my participation in the procedure from the performing physician.

## 2023-12-10 NOTE — Op Note (Signed)
  [facility] Patient Name: Denise Chambers Procedure Date: 12/10/2023 8:13 AM MRN: [id number] Endoscopist: Maren Beach , MD, [id number] Age: 68 Referring MD:  Date of Birth: March 19, 1956 Gender: Female Account #: [id number] Procedure:                Colonoscopy Indications:              Last colonoscopy: 2018, Rectal bleeding, Disease                            activity assessment of chronic ulcerative proctitis                            - patient is using hydrocortisone suppositories 25                            mg nightly at the time of this exam, and reports                            intermittent rectal bleeding Medicines:                Monitored Anesthesia Care Procedure:                Pre-Anesthesia Assessment:                           - Prior to the procedure, a History and Physical                            was performed, and patient medications and                            allergies were reviewed. The patient's tolerance of                            previous anesthesia was also reviewed. The risks                            and benefits of the procedure and the sedation                            options and risks were discussed with the patient.                            All questions were answered, and informed consent                            was obtained. Prior Anticoagulants: The patient has                            taken no anticoagulant or antiplatelet agents. ASA                            Grade Assessment: II - A patient with mild systemic  disease. After reviewing the risks and benefits,                            the patient was deemed in satisfactory condition to                            undergo the procedure.                           After obtaining informed consent, the colonoscope                            was passed under direct vision. Throughout the                            procedure, the patient's blood  pressure, pulse, and                            oxygen saturations were monitored continuously. The                            CF [id number] #[id number] was introduced through the anus                            and advanced to the terminal ileum. The colonoscopy                            was performed without difficulty. The patient                            tolerated the procedure well. The quality of the                            bowel preparation was good. The terminal ileum,                            ileocecal valve, appendiceal orifice, and rectum                            were photographed. Scope In: 8:25:01 AM Scope Out: 8:51:55 AM Scope Withdrawal Time: 0 hours 20 minutes 59 seconds  Total Procedure Duration: 0 hours 26 minutes 54 seconds  Findings:                 Hemorrhoids were found on perianal exam.                           The digital rectal exam was normal. Pertinent                            negatives include normal sphincter tone and no                            palpable rectal lesions.  Inflammation was found in a continuous and                            circumferential pattern from the anus to the distal                            rectum ~5 cm. This was graded as Mayo Score 1                            (mild, with erythema, decreased vascular pattern,                            mild friability). Biopsies were taken with a cold                            forceps for histology. Proximal rectum was normal.                           The sigmoid colon, descending colon, transverse                            colon, ascending colon and cecum appeared normal.                            Biopsies were taken with a cold forceps for                            histology.                           Three sessile polyps were found in the sigmoid                            colon and descending colon. The polyps were 3 to 4                            mm in  size. These polyps were removed with a cold                            biopsy forceps. Resection and retrieval were                            complete.                           The terminal ileum appeared normal.                           Internal hemorrhoids were found during retroflexion. Complications:            No immediate complications. Estimated blood loss:                            Minimal. Estimated Blood Loss:     Estimated blood loss was  minimal. Impression:               - Hemorrhoids found on perianal exam. This could be                            contributing to rectal bleeding as well.                           - Mild (Mayo Score 1) proctitis ulcerative colitis                            involving the distal 5 cm of the rectum. Proximal                            rectum was normal. Biopsied.                           - The sigmoid colon, descending colon, transverse                            colon, ascending colon and cecum are normal.                            Biopsied.                           - Three 3 to 4 mm polyps in the sigmoid colon and                            in the descending colon, removed with a cold biopsy                            forceps. Resected and retrieved.                           - The examined portion of the ileum was normal.                           - Internal hemorrhoids. Recommendation:           - Discharge patient to home (ambulatory).                           - Await pathology results.                           - Increase suppositories to twice daily dosing -                            can use hydrocortisone in evening and mesalamine in                            morning - if this is ineffective use hydrocortisone  suppositories twice daily.                           - The findings and recommendations were discussed                            with the patient's family.                           - Return  to GI clinic in 1 month.                           - Patient has a contact number available for                            emergencies. The signs and symptoms of potential                            delayed complications were discussed with the                            patient. Return to normal activities tomorrow.                            Written discharge instructions were provided to the                            patient. Maren Beach, MD 12/10/2023 9:04:54 AM This report has been signed electronically.

## 2023-12-10 NOTE — Patient Instructions (Addendum)
 Await pathology results  Return to GI clinic in 1 month  Handouts on hemorrhoids and polyps given    YOU HAD AN ENDOSCOPIC PROCEDURE TODAY AT THE [facility]:   Refer to the procedure report that was given to you for any specific questions about what was found during the examination.  If the procedure report does not answer your questions, please call your gastroenterologist to clarify.  If you requested that your care partner not be given the details of your procedure findings, then the procedure report has been included in a sealed envelope for you to review at your convenience later.  YOU SHOULD EXPECT: Some feelings of bloating in the abdomen. Passage of more gas than usual.  Walking can help get rid of the air that was put into your GI tract during the procedure and reduce the bloating. If you had a lower endoscopy (such as a colonoscopy or flexible sigmoidoscopy) you may notice spotting of blood in your stool or on the toilet paper. If you underwent a bowel prep for your procedure, you may not have a normal bowel movement for a few days.  Please Note:  You might notice some irritation and congestion in your nose or some drainage.  This is from the oxygen used during your procedure.  There is no need for concern and it should clear up in a day or so.  SYMPTOMS TO REPORT IMMEDIATELY:  Following lower endoscopy (colonoscopy or flexible sigmoidoscopy):  Excessive amounts of blood in the stool  Significant tenderness or worsening of abdominal pains  Swelling of the abdomen that is new, acute  Fever of 100F or higher   For urgent or emergent issues, a gastroenterologist can be reached at any hour by calling [phone]. Do not use MyChart messaging for urgent concerns.    DIET:  We do recommend a small meal at first, but then you may proceed to your regular diet.  Drink plenty of fluids but you should avoid alcoholic beverages for 24 hours.  ACTIVITY:  You should plan to  take it easy for the rest of today and you should NOT DRIVE or use heavy machinery until tomorrow (because of the sedation medicines used during the test).    FOLLOW UP: Our staff will call the number listed on your records the next business day following your procedure.  We will call around 7:15- 8:00 am to check on you and address any questions or concerns that you may have regarding the information given to you following your procedure. If we do not reach you, we will leave a message.     If any biopsies were taken you will be contacted by phone or by letter within the next 1-3 weeks.  Please call us at [phone] if you have not heard about the biopsies in 3 weeks.    SIGNATURES/CONFIDENTIALITY: You and/or your care partner have signed paperwork which will be entered into your electronic medical record.  These signatures attest to the fact that that the information above on your After Visit Summary has been reviewed and is understood.  Full responsibility of the confidentiality of this discharge information lies with you and/or your care-partner.

## 2023-12-10 NOTE — Progress Notes (Signed)
 Vss nad trans to pacu

## 2023-12-11 ENCOUNTER — Telehealth: Payer: Self-pay | Admitting: *Deleted

## 2023-12-11 NOTE — Telephone Encounter (Signed)
  Follow up Call-     12/10/2023    7:23 AM  Call back number  Post procedure Call Back phone  # [phone]  Permission to leave phone message Yes     Patient questions:  Do you have a fever, pain , or abdominal swelling? No. Pain Score  0 *  Have you tolerated food without any problems? Yes.    Have you been able to return to your normal activities? Yes.    Do you have any questions about your discharge instructions: Diet   No. Medications  No. Follow up visit  No.  Do you have questions or concerns about your Care? Yes.  Pt reports small amount of blood and having brown watery stools until about 2pm yesterday. Explained to pt that it is not uncommon to have residual results of diarrhea d/t the bowel prep. She reports she has been fine since 2pm yesterday.   Actions: * If pain score is 4 or above: No action needed, pain <4.

## 2023-12-14 LAB — SURGICAL PATHOLOGY

## 2023-12-15 ENCOUNTER — Encounter: Payer: Self-pay | Admitting: Pediatrics

## 2023-12-15 DIAGNOSIS — M5416 Radiculopathy, lumbar region: Secondary | ICD-10-CM | POA: Diagnosis not present

## 2023-12-16 ENCOUNTER — Telehealth: Payer: Self-pay

## 2023-12-16 DIAGNOSIS — M5459 Other low back pain: Secondary | ICD-10-CM | POA: Diagnosis not present

## 2023-12-16 MED ORDER — HYDROCORTISONE 100 MG/60ML RE ENEM: 1.0000 | ENEMA | Freq: Every day | RECTAL | 3 refills | Status: AC

## 2023-12-16 NOTE — Telephone Encounter (Signed)
 Budesonide foam enema is not on patient's formulary. Hydrocortisone enema sent to pharmacy on file. MyChart message sent to patient with recommendations.

## 2023-12-16 NOTE — Telephone Encounter (Signed)
-----   Message from Ottie Glazier sent at 12/15/2023  9:24 PM EDT ----- Regarding: Budesonide foam enema Hi nurses -  Ms. Chatmon has a history of ulcerative proctitis that has only partially responded to hydrocortisone suppository therapy.  I spoke with her about trialing budesonide (Uceris) foam enema per rectum nightly and lieu of hydrocortisone suppository.  This may or may not be covered by insurance.  If budesonide foam enema is not covered by insurance, please try substituting hydrocortisone enema 60 g per rectum nightly.  You can provide a 1 month supply of these treatments with 3 refills  Please let me know if you have any questions  Thanks,  Harriett Sine

## 2023-12-28 DIAGNOSIS — M5459 Other low back pain: Secondary | ICD-10-CM | POA: Diagnosis not present

## 2023-12-31 DIAGNOSIS — M48061 Spinal stenosis, lumbar region without neurogenic claudication: Secondary | ICD-10-CM | POA: Diagnosis not present

## 2023-12-31 DIAGNOSIS — M5126 Other intervertebral disc displacement, lumbar region: Secondary | ICD-10-CM | POA: Diagnosis not present

## 2023-12-31 DIAGNOSIS — Z9889 Other specified postprocedural states: Secondary | ICD-10-CM | POA: Diagnosis not present

## 2023-12-31 DIAGNOSIS — Z4889 Encounter for other specified surgical aftercare: Secondary | ICD-10-CM | POA: Diagnosis not present

## 2023-12-31 DIAGNOSIS — M5416 Radiculopathy, lumbar region: Secondary | ICD-10-CM | POA: Diagnosis not present

## 2024-01-08 DIAGNOSIS — M5416 Radiculopathy, lumbar region: Secondary | ICD-10-CM | POA: Diagnosis not present

## 2024-01-14 NOTE — Progress Notes (Unsigned)
 01/15/2024 Denise Chambers [id number] 1956/04/05  Referring provider: Creola Corn, MD Primary GI doctor: Dr. 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 Fecal calprotectin minor elevation in Dec 138, will repeat labs Patient is not on any daily medication at this time, consider enema versus daily mesalamine supp if still elevated, per Dr. Doy Hutching Declines blood work this OV,was normal last visit UTD on vaccinations other than Prevnar, she will get at PCP Declines follow up unless labs are abnormal, recall follow up 1 year placed for OV with Dr. Doy Hutching  Tubular adenomatous polyps Last colonoscopy was performed in 2021 demonstrating a tubular adenoma but no evidence of active inflammation  Most recent colonoscopy 12/2023 without polyps Will discuss with Dr. Doy Hutching recall colonoscopy date  Cutaneous lupus  Psoriasis  potentially infliximab related   Patient Care Team: Creola Corn, MD as PCP - General (Internal Medicine)  HISTORY OF PRESENT ILLNESS: 68 y.o. female presents for evaluation of ulcerative proctitis. Last seen in the office on 10/20/2023 with Dr. Doy Hutching.   IBD history: Symptoms of UC initially were rectal bleeding, diarrhea, bloating more than 20 years ago in the early 2000's She recalls undergoing evaluation and being told that she had a limited form of ulcerative proctitis Was initially treated with oral medications which were unfortunately ineffective (she cannot recall names) Recounts that she was treated with Remicade for 1 year shortly after the time of diagnosis which worked for her but she developed a psoriasiform rash She is not entirely clear why Remicade was discontinued or if it was related to the psoriasis She was  subsequently transitioned to what sounds to be oral mesalamine in conjunction with rectal mesalamine   Available records from 2019-2021 describes a history of ulcerative 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 She was documented to have mild flare symptoms in 2019 which she was treated with Canasa suppositories 04/2020 - 2 sigmoid colon polyps (adenoma x1), internal hemorrhoids and description of a 5-year recall Due to cost she was being maintained on a formulation of mesalamine obtained from [place] Raffasal 250 mg daily but she had been off of this for at least 6 years. Hydrocortisone suppositories 25 mg nightly for symptoms 10/2023  colonoscopy 12/10/2023 with biopsies consistent with mild chronic active proctitis in the rectum. 12/10/2023 patient sent in budesonide foam enema, if not covered hydrocortisone enemas.  Last colonoscopy: 12/10/2023 for rectal bleeding disease activity assessment on hydrocortisone suppositories 25 mg nightly found hemorrhoids, inflammation continuous circumferential pattern anus to distal rectum 5 cm Mayo score 1, unremarkable sigmoid descending transverse ascending and cecum, 3 polyps 3 to 4 mm size sigmoid descending normal TI.  Hyperplastic polyps, rectum focal cryptitis chronic mildly active colitis negative for dysplasia, random biopsies unremarkable. Last endoscopy: None Last Abd CT/CTE/MRE: None  IBD Medication History Oral and rectal mesalamine Remicade-treated for 1 year shortly after diagnosis and states she developed psoriasis Surgical history: no surgery.   Current History 10/20/2023 patient seen by Dr. Doy Hutching in the office for fecal urgency and rectal bleeding was on oral mesalamine from [place] for cost purposes but this was off for years, Canasa suppositories 1 g rectum nightly had some improvement of symptoms however she continued to have rectal bleeding, started on hydrocortisone suppositories  colonoscopy was done 12/10/2023 that showed Mayo score 1 from anus to  distal rectum hyperplastic polyps, mildly active colitis negative for dysplasia internal hemorrhoids. 12/10/2023 patient given budesonide enema if not covered hydrocortisone enemas.  Presents for follow up She never picked up the enemas, she finished the hydrocortisone supp several weeks ago and states she has been doing well.   Discussed the use of AI scribe software for clinical note transcription with the patient, who gave verbal consent to proceed.  History of Present Illness   Denise Chambers is a 68 year old female with ulcerative colitis who presents for follow-up of her condition.  She has a long-standing history of ulcerative colitis, with symptoms beginning in the early 2000s. Over the years, she has been managed with various treatments, including oral and rectal mesalamine. Recently, she experienced urgency and other issues, which have since resolved. She was prescribed mesalamine suppositories and hydrocortisone suppositories, which she completed a couple of weeks ago. She has not been on any oral mesalamine for several years, having stopped it due to cost and lack of efficacy for her condition. Her ulcerative colitis is currently well-controlled with the use of suppositories during flare-ups.  She has bowel movements two to three times a day, with occasional constipation characterized by 'hard balls.' She manages this with a supplement called Enamel, which contains probiotics and enzymes, and finds it effective. Her diet is rich in fruits and vegetables, she avoids red meat, and eats chicken and [place]. Nuts are also part of her diet, which help with bowel regularity. No abdominal pain, blood in the stool, or urgency.  She occasionally experiences shortness of breath, which she attributes to a possible past RSV infection. No reflux or heartburn, and her shortness of breath is not associated with exertion.  She has a history of allergies, which she manages with nasal spray and eye  drops.  She has received various vaccinations, including flu, COVID, and shingles, but has not yet received the pneumonia vaccine.      Recent labs: 09/25/2023 CRP <1.0  Fecal cal 09/28/2023 138 09/25/2023 WBC 6.3 HGB 13.5 MCV 83.6 Platelets 318.0 No recent Iron/ferritin 09/25/2023 AST 15 ALT 19 Alkphos 104 TBili 0.6  TB GOLD none HepBsAG none TPMT Activity: none  IBD Health Care Maintenance: Annual Flu Vaccine - UTD Pneumococcal Vaccine if receiving immunosuppression: -  will get next year TB testing if on anti-TNF, yearly - next OV Vitamin D screening - screening next OV COVID vaccine- UTD Shingrix - UTD  Immunization History  Administered Date(s) Administered   Influenza Split 07/22/2014   Influenza,inj,Quad PF,6+ Mos 07/21/2018   Influenza,inj,quad, With Preservative 08/04/2018    RELEVANT GI HISTORY, LABS, IMAGING:  CBC    Component Value Date/Time   WBC 6.3 09/25/2023 0811   RBC 4.94 09/25/2023 0811   HGB 13.5 09/25/2023 0811   HCT 41.3 09/25/2023 0811   PLT 318.0 09/25/2023 0811   MCV 83.6 09/25/2023 0811   MCHC 32.8 09/25/2023 0811   RDW 14.1 09/25/2023 0811   LYMPHSABS 2.5 09/25/2023 0811   MONOABS 0.5 09/25/2023 0811   EOSABS 0.4 09/25/2023 0811   BASOSABS 0.1 09/25/2023 0811   Recent Labs    09/25/23 0811  HGB 13.5    CMP     Component Value Date/Time   NA 138 09/25/2023 0811   K 3.8 09/25/2023 0811   CL 102 09/25/2023 0811   CO2 28 09/25/2023 0811   GLUCOSE 78 09/25/2023 0811   BUN 19 09/25/2023 0811   CREATININE 0.63 09/25/2023 0811   CALCIUM 9.5 09/25/2023  0811   PROT 7.2 09/25/2023 0811   ALBUMIN 3.9 09/25/2023 0811   AST 15 09/25/2023 0811   ALT 19 09/25/2023 0811   ALKPHOS 104 09/25/2023 0811   BILITOT 0.6 09/25/2023 0811      Latest Ref Rng & Units 09/25/2023    8:11 AM  Hepatic Function  Total Protein 6.0 - 8.3 g/dL 7.2   Albumin 3.5 - 5.2 g/dL 3.9   AST 0 - 37 U/L 15   ALT 0 - 35 U/L 19   Alk Phosphatase 39 - 117 U/L  104   Total Bilirubin 0.2 - 1.2 mg/dL 0.6       Current Medications:      Current Outpatient Medications (Analgesics):    acetaminophen (TYLENOL) 650 MG CR tablet, Take 650 mg by mouth as needed.   Current Outpatient Medications (Other):    Cholecalciferol 50 MCG (2000 UT) TABS, Take 2 tablets by mouth daily.   DIGESTIVE ENZYMES PO, Take 2 capsules by mouth daily.   DULoxetine (CYMBALTA) 30 MG capsule, Take 30 mg by mouth daily as needed.   gabapentin (NEURONTIN) 300 MG capsule, Take 300 mg by mouth 3 (three) times daily.   hydrocortisone (CORTENEMA) 100 MG/60ML enema, Place 1 enema (100 mg total) rectally at bedtime.   magnesium gluconate (MAGONATE) 500 MG tablet, Take 500 mg by mouth daily.   mesalamine (CANASA) 1000 MG suppository, Insert 1 suppository per rectum every night. [email or web address]  [id number]   valACYclovir (VALTREX) 1000 MG tablet, valacyclovir 1 gram tablet  Medical History:  Past Medical History:  Diagnosis Date   Anxiety    Cutaneous lupus erythematosus    Depression    Psoriasis    Developed on infliximab   Ulcerative colitis (HCC)    Allergies:  Allergies  Allergen Reactions   Sulfa Antibiotics Rash and Other (See Comments)    hallucinations     Surgical History:  She  has a past surgical history that includes Breast biopsy (Right, 10/14/2022); Lumbar disc surgery; Tubal ligation; and Colonoscopy. Family History:  Her family history includes Breast cancer in her paternal aunt; Cancer in her mother; Heart attack in her father; Heart disease in her father; Hyperlipidemia in her brother, mother, and sister; Hypertension in her mother; Stomach cancer in her paternal aunt; Stroke in her father.  REVIEW OF SYSTEMS  : All other systems reviewed and negative except where noted in the History of Present Illness.  PHYSICAL EXAM: BP 110/60   Pulse 74   Ht 5\' 2"  (1.575 m)   Wt 166 lb 12.8 oz (75.7 kg)   SpO2 98%   BMI 30.51 kg/m  Physical Exam    GENERAL APPEARANCE: Well nourished, in no apparent distress. HEENT: No cervical lymphadenopathy, unremarkable thyroid, sclerae anicteric, conjunctiva pink. RESPIRATORY: Respiratory effort normal, breath sounds clear to auscultation bilaterally without rales, rhonchi, or wheezing. CARDIO: Regular rate and rhythm with no murmurs, rubs, or gallops, peripheral pulses intact. ABDOMEN: Soft, non-distended, active bowel sounds in all four quadrants, no tenderness to palpation, no rebound, no mass appreciated. RECTAL: Declines. MUSCULOSKELETAL: Full range of motion, normal gait, without edema. SKIN: Dry, intact without rashes or lesions. No jaundice. NEURO: Alert, oriented, no focal deficits. PSYCH: Cooperative, normal mood and affect.        Doree Albee, PA-C 10:18 AM

## 2024-01-15 ENCOUNTER — Other Ambulatory Visit

## 2024-01-15 ENCOUNTER — Ambulatory Visit: Admitting: Physician Assistant

## 2024-01-15 VITALS — BP 110/60 | HR 74 | Ht 62.0 in | Wt 166.8 lb

## 2024-01-15 DIAGNOSIS — Z860101 Personal history of adenomatous and serrated colon polyps: Secondary | ICD-10-CM | POA: Diagnosis not present

## 2024-01-15 DIAGNOSIS — L409 Psoriasis, unspecified: Secondary | ICD-10-CM | POA: Diagnosis not present

## 2024-01-15 DIAGNOSIS — K51219 Ulcerative (chronic) proctitis with unspecified complications: Secondary | ICD-10-CM

## 2024-01-15 DIAGNOSIS — K5120 Ulcerative (chronic) proctitis without complications: Secondary | ICD-10-CM | POA: Diagnosis not present

## 2024-01-15 DIAGNOSIS — Z860100 Personal history of colon polyps, unspecified: Secondary | ICD-10-CM

## 2024-01-15 NOTE — Patient Instructions (Addendum)
 Your provider has requested that you go to the basement level for lab work before leaving today. Press "B" on the elevator. The lab is located at the first door on the left as you exit the elevator.  Due to recent changes in healthcare laws, you may see the results of your imaging and laboratory studies on MyChart before your provider has had a chance to review them.  We understand that in some cases there may be results that are confusing or concerning to you. Not all laboratory results come back in the same time frame and the provider may be waiting for multiple results in order to interpret others.  Please give [place] 48 hours in order for your provider to thoroughly review all the results before contacting the office for clarification of your results.    Health Maintenance in Inflammatory Bowel Disease  Vaccines Inflammatory Bowel Disease (IBD) is often treated with immunomodulatory medications (6-mercaptopurine, azathioprine, methotrexate), biologic therapies (adalimumab, certulizomab, natalizumab, infliximab, vedolizumab) or chronic steroids (at least 10mg  daily for 3 months), which suppress the immune system.  Patients receiving this type of therapy are at higher risk to develop infections.  Many infections can be prevented by vaccinations. Inflammatory bowel disease within itself can cause inflammation and weaken the immune system.  Discuss vaccinations with your physician, but in general, IBD patients SHOULD receive the following vaccines:  Shingles vaccination Patient is at increased risk for immune compromised such as those with inflammatory bowel disease are able to get the Shingrix vaccine age 62 and up. 2 part shot, second shot between second and 69-month.  If you go over 6 months for the second shot have to restart series. Shingles is herpes virus dormant along the nerve root, can cause deafness, blindness and lasting pain.  Hepatitis A and B -We can test for this and able to get this  in our office.   Human papilloma virus (HPV)  The HPV vaccine is recommended in females aged 69-45. It is a 2-dose vaccine.   The HPV vaccine is also now recommended in males aged 13-45  Influenza  All adults should get the influenza vaccine annually.  IBD patients on immunosuppressive therapy should NOT get the intranasal vaccine, as it is a live vaccine.  Pneumococcal  All adults 65 years or older should receive the Pneumococcal Conjugate Vaccine (PCV13), followed by the Pneumococcal Polysaccharide Vaccine (PPSV23) at least 1 year later.  In addition, patients over the age of 80 on immunosuppressive therapy should receive the PCV13, followed by PPSV23 no sooner than 8 weeks later.  A booster PPSV23 is also recommended 5 years later.  Finally, patients that smoke should get the PPSV23 if not otherwise vaccinated.  Tetanus, diphtheria, pertussis (Tdap/Td)  All adults should receive a 1-time Tdap.  A Td booster should be administered every 10 years.   Bone Health Patients with IBD can have markedly lower bone mineral densities than patients without IBD. Low bone mineral density is associated with fractures. Therefore, screening for bone disease, such as osteoporosis and osteopenia, is important in certain populations.  The American Gastroenterology Association (AGA) and the American College of Gastroenterology [facility]) recommend dual-energy x-ray absorptiometry scanning (DEXA) in postmenopausal women, men over the age of 46, patients with prolonged corticosteroid use (greater than 3 consecutive months or recurrent courses), patients with a personal history of low trauma fracture and patients with hypogonadism.   Tobacco Cessation Smoking worsens Crohn's disease. In addition, smoking is associated with many known cardiac, pulmonary and oncologic  risks.   If you need help with quitting smoking, please talk to your doctor and call 1-800-QUIT NOW.  Cancer Screening IBD patients on  immunosuppressive therapy may be more susceptible to certain types of cancer. However, specific evidence is conflicting. Therefore, patients with IBD should undergo age- and sex-specific cancer screening. Please discuss this important issue with your primary care provider.  Colon Cancer  Patients with either Ulcerative Colitis (UC) or Crohn's disease involving the colon should have colon cancer screening 8-10 years after initial diagnosis. After that time, the frequency of surveillance colonocoscopy will be determined by your gastroenterologist, but will usually occur every 1-2 years.   Patients with IBD and Primary Sclerosing Cholangitis (PSC) need a screening colonoscopy at the time of diagnosis of PSC and annually thereafter.

## 2024-01-19 ENCOUNTER — Other Ambulatory Visit

## 2024-01-19 DIAGNOSIS — K51219 Ulcerative (chronic) proctitis with unspecified complications: Secondary | ICD-10-CM

## 2024-01-24 LAB — CALPROTECTIN: Calprotectin: 230 ug/g — ABNORMAL HIGH

## 2024-01-28 DIAGNOSIS — M48061 Spinal stenosis, lumbar region without neurogenic claudication: Secondary | ICD-10-CM | POA: Diagnosis not present

## 2024-01-28 DIAGNOSIS — M5416 Radiculopathy, lumbar region: Secondary | ICD-10-CM | POA: Diagnosis not present

## 2024-01-28 DIAGNOSIS — Z4889 Encounter for other specified surgical aftercare: Secondary | ICD-10-CM | POA: Diagnosis not present

## 2024-01-28 DIAGNOSIS — Z9889 Other specified postprocedural states: Secondary | ICD-10-CM | POA: Diagnosis not present

## 2024-02-16 DIAGNOSIS — H2513 Age-related nuclear cataract, bilateral: Secondary | ICD-10-CM | POA: Diagnosis not present

## 2024-02-16 DIAGNOSIS — H25043 Posterior subcapsular polar age-related cataract, bilateral: Secondary | ICD-10-CM | POA: Diagnosis not present

## 2024-02-16 DIAGNOSIS — H25013 Cortical age-related cataract, bilateral: Secondary | ICD-10-CM | POA: Diagnosis not present

## 2024-02-16 DIAGNOSIS — H2511 Age-related nuclear cataract, right eye: Secondary | ICD-10-CM | POA: Diagnosis not present

## 2024-02-26 DIAGNOSIS — M79644 Pain in right finger(s): Secondary | ICD-10-CM | POA: Diagnosis not present

## 2024-03-04 DIAGNOSIS — H02835 Dermatochalasis of left lower eyelid: Secondary | ICD-10-CM | POA: Diagnosis not present

## 2024-03-04 DIAGNOSIS — H02831 Dermatochalasis of right upper eyelid: Secondary | ICD-10-CM | POA: Diagnosis not present

## 2024-03-04 DIAGNOSIS — H02834 Dermatochalasis of left upper eyelid: Secondary | ICD-10-CM | POA: Diagnosis not present

## 2024-03-04 DIAGNOSIS — H02832 Dermatochalasis of right lower eyelid: Secondary | ICD-10-CM | POA: Diagnosis not present

## 2024-03-22 DIAGNOSIS — K080 Exfoliation of teeth due to systemic causes: Secondary | ICD-10-CM | POA: Diagnosis not present

## 2024-04-06 DIAGNOSIS — S61211A Laceration without foreign body of left index finger without damage to nail, initial encounter: Secondary | ICD-10-CM | POA: Diagnosis not present

## 2024-04-07 DIAGNOSIS — Z23 Encounter for immunization: Secondary | ICD-10-CM | POA: Diagnosis not present

## 2024-04-12 DIAGNOSIS — S61211A Laceration without foreign body of left index finger without damage to nail, initial encounter: Secondary | ICD-10-CM | POA: Diagnosis not present

## 2024-04-12 DIAGNOSIS — M7701 Medial epicondylitis, right elbow: Secondary | ICD-10-CM | POA: Diagnosis not present

## 2024-04-12 DIAGNOSIS — M7711 Lateral epicondylitis, right elbow: Secondary | ICD-10-CM | POA: Diagnosis not present

## 2024-04-12 DIAGNOSIS — M65321 Trigger finger, right index finger: Secondary | ICD-10-CM | POA: Diagnosis not present

## 2024-04-18 DIAGNOSIS — R232 Flushing: Secondary | ICD-10-CM | POA: Diagnosis not present

## 2024-04-18 DIAGNOSIS — L570 Actinic keratosis: Secondary | ICD-10-CM | POA: Diagnosis not present

## 2024-04-18 DIAGNOSIS — L820 Inflamed seborrheic keratosis: Secondary | ICD-10-CM | POA: Diagnosis not present

## 2024-04-21 DIAGNOSIS — H2511 Age-related nuclear cataract, right eye: Secondary | ICD-10-CM | POA: Diagnosis not present

## 2024-04-22 DIAGNOSIS — H25012 Cortical age-related cataract, left eye: Secondary | ICD-10-CM | POA: Diagnosis not present

## 2024-04-22 DIAGNOSIS — H2512 Age-related nuclear cataract, left eye: Secondary | ICD-10-CM | POA: Diagnosis not present

## 2024-04-22 DIAGNOSIS — H25042 Posterior subcapsular polar age-related cataract, left eye: Secondary | ICD-10-CM | POA: Diagnosis not present

## 2024-04-22 DIAGNOSIS — H2511 Age-related nuclear cataract, right eye: Secondary | ICD-10-CM | POA: Diagnosis not present

## 2024-05-05 DIAGNOSIS — H2512 Age-related nuclear cataract, left eye: Secondary | ICD-10-CM | POA: Diagnosis not present

## 2024-05-06 DIAGNOSIS — H2512 Age-related nuclear cataract, left eye: Secondary | ICD-10-CM | POA: Diagnosis not present

## 2024-05-06 DIAGNOSIS — H25042 Posterior subcapsular polar age-related cataract, left eye: Secondary | ICD-10-CM | POA: Diagnosis not present

## 2024-05-06 DIAGNOSIS — H25012 Cortical age-related cataract, left eye: Secondary | ICD-10-CM | POA: Diagnosis not present

## 2024-07-18 DIAGNOSIS — M7711 Lateral epicondylitis, right elbow: Secondary | ICD-10-CM | POA: Diagnosis not present

## 2024-07-18 DIAGNOSIS — M19021 Primary osteoarthritis, right elbow: Secondary | ICD-10-CM | POA: Diagnosis not present

## 2024-07-18 DIAGNOSIS — M1811 Unilateral primary osteoarthritis of first carpometacarpal joint, right hand: Secondary | ICD-10-CM | POA: Diagnosis not present

## 2024-07-18 DIAGNOSIS — M7701 Medial epicondylitis, right elbow: Secondary | ICD-10-CM | POA: Diagnosis not present

## 2024-07-19 DIAGNOSIS — K080 Exfoliation of teeth due to systemic causes: Secondary | ICD-10-CM | POA: Diagnosis not present

## 2024-07-20 DIAGNOSIS — K080 Exfoliation of teeth due to systemic causes: Secondary | ICD-10-CM | POA: Diagnosis not present

## 2024-07-26 DIAGNOSIS — Z1212 Encounter for screening for malignant neoplasm of rectum: Secondary | ICD-10-CM | POA: Diagnosis not present

## 2024-07-26 DIAGNOSIS — E049 Nontoxic goiter, unspecified: Secondary | ICD-10-CM | POA: Diagnosis not present

## 2024-07-26 DIAGNOSIS — R030 Elevated blood-pressure reading, without diagnosis of hypertension: Secondary | ICD-10-CM | POA: Diagnosis not present

## 2024-07-26 DIAGNOSIS — E559 Vitamin D deficiency, unspecified: Secondary | ICD-10-CM | POA: Diagnosis not present

## 2024-08-02 DIAGNOSIS — Z0000 Encounter for general adult medical examination without abnormal findings: Secondary | ICD-10-CM | POA: Diagnosis not present

## 2024-08-02 DIAGNOSIS — Z1389 Encounter for screening for other disorder: Secondary | ICD-10-CM | POA: Diagnosis not present

## 2024-08-02 DIAGNOSIS — Z1331 Encounter for screening for depression: Secondary | ICD-10-CM | POA: Diagnosis not present

## 2024-08-02 DIAGNOSIS — D8989 Other specified disorders involving the immune mechanism, not elsewhere classified: Secondary | ICD-10-CM | POA: Diagnosis not present

## 2024-08-02 DIAGNOSIS — R82998 Other abnormal findings in urine: Secondary | ICD-10-CM | POA: Diagnosis not present
